# Patient Record
Sex: Female | Born: 1985 | Hispanic: No | Smoking: Never smoker
Health system: Southern US, Community
[De-identification: ages and names within clinical notes are randomized; demographics above are authoritative.]

## PROBLEM LIST (undated history)

## (undated) DIAGNOSIS — N189 Chronic kidney disease, unspecified: Secondary | ICD-10-CM

## (undated) DIAGNOSIS — C801 Malignant (primary) neoplasm, unspecified: Secondary | ICD-10-CM

## (undated) DIAGNOSIS — F32A Depression, unspecified: Secondary | ICD-10-CM

## (undated) DIAGNOSIS — F419 Anxiety disorder, unspecified: Secondary | ICD-10-CM

## (undated) HISTORY — PX: INDUCED ABORTION: SHX677

## (undated) HISTORY — PX: NEPHRECTOMY: SHX65

---

## 2014-03-22 DIAGNOSIS — F319 Bipolar disorder, unspecified: Secondary | ICD-10-CM | POA: Insufficient documentation

## 2015-03-21 DIAGNOSIS — Z85528 Personal history of other malignant neoplasm of kidney: Secondary | ICD-10-CM | POA: Insufficient documentation

## 2015-04-17 DIAGNOSIS — C649 Malignant neoplasm of unspecified kidney, except renal pelvis: Secondary | ICD-10-CM

## 2015-04-17 HISTORY — PX: NEPHRECTOMY: SHX65

## 2015-04-17 HISTORY — DX: Malignant neoplasm of unspecified kidney, except renal pelvis: C64.9

## 2016-04-27 DIAGNOSIS — F4310 Post-traumatic stress disorder, unspecified: Secondary | ICD-10-CM | POA: Insufficient documentation

## 2017-09-26 ENCOUNTER — Other Ambulatory Visit: Payer: Self-pay | Admitting: Otolaryngology

## 2017-09-26 DIAGNOSIS — R42 Dizziness and giddiness: Secondary | ICD-10-CM

## 2017-09-29 ENCOUNTER — Ambulatory Visit
Admission: RE | Admit: 2017-09-29 | Discharge: 2017-09-29 | Disposition: A | Discharge status: Home / Self Care | Payer: BLUE CROSS/BLUE SHIELD | Source: Ambulatory Visit | Attending: Otolaryngology | Admitting: Otolaryngology

## 2017-09-29 DIAGNOSIS — R42 Dizziness and giddiness: Secondary | ICD-10-CM

## 2017-09-29 MED ORDER — GADOBENATE DIMEGLUMINE 529 MG/ML IV SOLN
13.0000 mL | Freq: Once | INTRAVENOUS | Status: AC | PRN
  Administered 2017-09-29: 13 mL via INTRAVENOUS

## 2018-03-24 ENCOUNTER — Other Ambulatory Visit: Payer: Self-pay | Admitting: Family Medicine

## 2018-03-24 DIAGNOSIS — R102 Pelvic and perineal pain: Secondary | ICD-10-CM

## 2018-03-26 ENCOUNTER — Other Ambulatory Visit: Payer: BLUE CROSS/BLUE SHIELD

## 2018-03-28 ENCOUNTER — Other Ambulatory Visit: Payer: BLUE CROSS/BLUE SHIELD

## 2018-04-03 ENCOUNTER — Ambulatory Visit
Admission: RE | Admit: 2018-04-03 | Discharge: 2018-04-03 | Disposition: A | Discharge status: Home / Self Care | Payer: BLUE CROSS/BLUE SHIELD | Source: Ambulatory Visit | Attending: Family Medicine | Admitting: Family Medicine

## 2018-04-03 DIAGNOSIS — R102 Pelvic and perineal pain: Secondary | ICD-10-CM

## 2018-05-16 ENCOUNTER — Other Ambulatory Visit: Payer: Self-pay | Admitting: Family Medicine

## 2018-05-16 DIAGNOSIS — Z85528 Personal history of other malignant neoplasm of kidney: Secondary | ICD-10-CM

## 2018-05-31 ENCOUNTER — Other Ambulatory Visit: Payer: BLUE CROSS/BLUE SHIELD

## 2018-07-12 ENCOUNTER — Other Ambulatory Visit: Payer: BLUE CROSS/BLUE SHIELD

## 2018-09-04 ENCOUNTER — Other Ambulatory Visit: Payer: Self-pay

## 2018-09-04 ENCOUNTER — Ambulatory Visit
Admission: RE | Admit: 2018-09-04 | Discharge: 2018-09-04 | Disposition: A | Discharge status: Home / Self Care | Payer: BLUE CROSS/BLUE SHIELD | Source: Ambulatory Visit | Attending: Family Medicine | Admitting: Family Medicine

## 2018-09-04 DIAGNOSIS — Z85528 Personal history of other malignant neoplasm of kidney: Secondary | ICD-10-CM

## 2018-09-04 MED ORDER — GADOBENATE DIMEGLUMINE 529 MG/ML IV SOLN
15.0000 mL | Freq: Once | INTRAVENOUS | Status: AC | PRN
  Administered 2018-09-04: 15 mL via INTRAVENOUS

## 2019-01-07 ENCOUNTER — Other Ambulatory Visit (HOSPITAL_COMMUNITY)
Admission: RE | Admit: 2019-01-07 | Discharge: 2019-01-07 | Disposition: A | Discharge status: Home / Self Care | Payer: BC Managed Care – PPO | Source: Ambulatory Visit | Attending: Obstetrics and Gynecology | Admitting: Obstetrics and Gynecology

## 2019-01-07 DIAGNOSIS — Z124 Encounter for screening for malignant neoplasm of cervix: Secondary | ICD-10-CM | POA: Insufficient documentation

## 2019-07-19 ENCOUNTER — Emergency Department (HOSPITAL_COMMUNITY)
Admission: EM | Admit: 2019-07-19 | Discharge: 2019-07-19 | Disposition: A | Discharge status: Home / Self Care | Payer: BC Managed Care – PPO | Attending: Emergency Medicine | Admitting: Emergency Medicine

## 2019-07-19 ENCOUNTER — Other Ambulatory Visit: Payer: Self-pay

## 2019-07-19 ENCOUNTER — Encounter (HOSPITAL_COMMUNITY): Payer: Self-pay | Admitting: Emergency Medicine

## 2019-07-19 ENCOUNTER — Emergency Department (HOSPITAL_COMMUNITY): Payer: BC Managed Care – PPO

## 2019-07-19 DIAGNOSIS — Z79899 Other long term (current) drug therapy: Secondary | ICD-10-CM | POA: Diagnosis not present

## 2019-07-19 DIAGNOSIS — O200 Threatened abortion: Secondary | ICD-10-CM

## 2019-07-19 DIAGNOSIS — Z3A11 11 weeks gestation of pregnancy: Secondary | ICD-10-CM | POA: Insufficient documentation

## 2019-07-19 DIAGNOSIS — B9689 Other specified bacterial agents as the cause of diseases classified elsewhere: Secondary | ICD-10-CM

## 2019-07-19 DIAGNOSIS — O208 Other hemorrhage in early pregnancy: Secondary | ICD-10-CM | POA: Diagnosis present

## 2019-07-19 DIAGNOSIS — N760 Acute vaginitis: Secondary | ICD-10-CM | POA: Insufficient documentation

## 2019-07-19 LAB — I-STAT BETA HCG BLOOD, ED (MC, WL, AP ONLY): I-stat hCG, quantitative: >2000 m[IU]/mL — ABNORMAL HIGH (ref <5)

## 2019-07-19 LAB — CBC WITH DIFFERENTIAL/PLATELET
Abs Immature Granulocytes: 0.02 10*3/uL (ref 0.00–0.07)
Basophils Absolute: 0 10*3/uL (ref 0.0–0.1)
Basophils Relative: 0 %
Eosinophils Absolute: 0.1 10*3/uL (ref 0.0–0.5)
Eosinophils Relative: 2 %
HCT: 39 % (ref 36.0–46.0)
Hemoglobin: 12.8 g/dL (ref 12.0–15.0)
Immature Granulocytes: 0 %
Lymphocytes Relative: 19 %
Lymphs Abs: 1.2 10*3/uL (ref 0.7–4.0)
MCH: 29.8 pg (ref 26.0–34.0)
MCHC: 32.8 g/dL (ref 30.0–36.0)
MCV: 90.9 fL (ref 80.0–100.0)
Monocytes Absolute: 0.6 10*3/uL (ref 0.1–1.0)
Monocytes Relative: 10 %
Neutro Abs: 4.2 10*3/uL (ref 1.7–7.7)
Neutrophils Relative %: 69 %
Platelets: 278 10*3/uL (ref 150–400)
RBC: 4.29 MIL/uL (ref 3.87–5.11)
RDW: 12.3 % (ref 11.5–15.5)
WBC: 6.1 10*3/uL (ref 4.0–10.5)
nRBC: 0 % (ref 0.0–0.2)

## 2019-07-19 LAB — TYPE AND SCREEN
ABO/RH(D): A POS
Antibody Screen: NEGATIVE

## 2019-07-19 LAB — URINALYSIS, ROUTINE W REFLEX MICROSCOPIC
Bacteria, UA: NONE SEEN
Bilirubin Urine: NEGATIVE
Glucose, UA: NEGATIVE mg/dL
Ketones, ur: NEGATIVE mg/dL
Leukocytes,Ua: NEGATIVE
Nitrite: NEGATIVE
Protein, ur: NEGATIVE mg/dL
Specific Gravity, Urine: 1.01 (ref 1.005–1.030)
pH: 8 (ref 5.0–8.0)

## 2019-07-19 LAB — BASIC METABOLIC PANEL
Anion gap: 9 (ref 5–15)
BUN: 12 mg/dL (ref 6–20)
CO2: 24 mmol/L (ref 22–32)
Calcium: 9.6 mg/dL (ref 8.9–10.3)
Chloride: 103 mmol/L (ref 98–111)
Creatinine, Ser: 0.56 mg/dL (ref 0.44–1.00)
GFR calc Af Amer: >60 mL/min (ref >60)
GFR calc non Af Amer: >60 mL/min (ref >60)
Glucose, Bld: 95 mg/dL (ref 70–99)
Potassium: 3.9 mmol/L (ref 3.5–5.1)
Sodium: 136 mmol/L (ref 135–145)

## 2019-07-19 LAB — WET PREP, GENITAL
Sperm: NONE SEEN
Trich, Wet Prep: NONE SEEN
Yeast Wet Prep HPF POC: NONE SEEN

## 2019-07-19 LAB — HCG, QUANTITATIVE, PREGNANCY: hCG, Beta Chain, Quant, S: 5468 m[IU]/mL — ABNORMAL HIGH (ref <5)

## 2019-07-19 LAB — ABO/RH: ABO/RH(D): A POS

## 2019-07-19 NOTE — ED Provider Notes (Signed)
[facility] DEPT Provider Note   CSN: [id number] Arrival date & time: 07/19/19  [id number]     History Chief Complaint  Patient presents with   Vaginal Bleeding   Pregnant    Melanie Cooley is a 34 y.o. female currently [redacted] weeks pregnant presenting to the ED with a chief complaint of vaginal bleeding that occurred just prior to arrival.  Patient receiving prenatal care and had an ultrasound done at 7 weeks which showed single IUP.  She woke up this morning and noticed a single blood clot when she tried to urinate.  States that the bleeding is now minimal.  She reports intermittent cramping for the past month which she thought was due to the pregnancy itself.  She denies any nausea, vomiting, diarrhea, lightheadedness or anticoagulant use.  She has had 1 prior pregnancy for which she had an abortion at 6 weeks.  She denies any vaginal discharge, fever, chest pain or shortness of breath.  HPI     History reviewed. No pertinent past medical history.  There are no problems to display for this patient.   History reviewed. No pertinent surgical history.   OB History    Gravida  1   Para      Term      Preterm      AB      Living        SAB      TAB      Ectopic      Multiple      Live Births              No family history on file.  Social History   Tobacco Use   Smoking status: Never Smoker   Smokeless tobacco: Never Used  Substance Use Topics   Alcohol use: Never   Drug use: Never    Home Medications Prior to Admission medications   Medication Sig Start Date End Date Taking? Authorizing Provider  acetaminophen (TYLENOL) 325 MG tablet Take 650 mg by mouth every 6 (six) hours as needed for mild pain or headache.   Yes [provider]  calcium carbonate (TUMS - DOSED IN MG ELEMENTAL CALCIUM) 500 MG chewable tablet Chew 1 tablet by mouth 2 (two) times daily as needed for indigestion or heartburn.   Yes [provider]  famotidine (PEPCID) 20 MG tablet Take 20 mg by mouth 2 (two) times daily as needed for heartburn or indigestion.   Yes [provider]  naphazoline-pheniramine (NAPHCON-A) 0.025-0.3 % ophthalmic solution Place 1 drop into both eyes 4 (four) times daily as needed for eye irritation or allergies.   Yes [provider]  Prenatal MV-Min-Fe Fum-FA-DHA (PRENATAL 1 PO) Take 2 tablets by mouth in the morning and at bedtime.   Yes [provider]    Allergies    Compazine [prochlorperazine] and Other  Review of Systems   Review of Systems  Constitutional: Negative for appetite change, chills and fever.  HENT: Negative for ear pain, rhinorrhea, sneezing and sore throat.   Eyes: Negative for photophobia and visual disturbance.  Respiratory: Negative for cough, chest tightness, shortness of breath and wheezing.   Cardiovascular: Negative for chest pain and palpitations.  Gastrointestinal: Positive for abdominal pain. Negative for blood in stool, constipation, diarrhea, nausea and vomiting.  Genitourinary: Positive for vaginal bleeding. Negative for dysuria, hematuria and urgency.  Musculoskeletal: Negative for myalgias.  Skin: Negative for rash.  Neurological: Negative for dizziness, weakness and light-headedness.  Physical Exam Updated Vital Signs BP 127/82 (BP Location: Left Arm)    Pulse 78    Temp 98.4 F (36.9 C) (Oral)    Resp 16    Ht 5\' 6"  (1.676 m)    Wt 74.4 kg    SpO2 98%    BMI 26.47 kg/m   Physical Exam Vitals and nursing note reviewed. Exam conducted with a chaperone present.  Constitutional:      General: She is not in acute distress.    Appearance: She is well-developed.  HENT:     Head: Normocephalic and atraumatic.     Nose: Nose normal.  Eyes:     General: No scleral icterus.       Left eye: No discharge.     Conjunctiva/sclera: Conjunctivae normal.  Cardiovascular:     Rate and Rhythm: Normal rate and regular rhythm.      Heart sounds: Normal heart sounds. No murmur. No friction rub. No gallop.   Pulmonary:     Effort: Pulmonary effort is normal. No respiratory distress.     Breath sounds: Normal breath sounds.  Abdominal:     General: Bowel sounds are normal. There is no distension.     Palpations: Abdomen is soft.     Tenderness: There is no abdominal tenderness. There is no guarding.  Genitourinary:    Vagina: Bleeding present.     Cervix: No cervical motion tenderness.     Uterus: Not tender.      Adnexa:        Right: No tenderness.         Left: No tenderness.       Comments: Pelvic exam: normal external genitalia without evidence of trauma. VULVA: normal appearing vulva with no masses, tenderness or lesion. VAGINA: normal appearing vagina with normal color and discharge, no lesions. Small amount of blood in vaginal vault. CERVIX: normal appearing cervix without lesions, cervical motion tenderness absent, cervical os closed with out purulent discharge; No vaginal discharge. Wet prep and DNA probe for chlamydia and GC obtained.   ADNEXA: normal adnexa in size, nontender and no masses UTERUS: uterus is normal size, shape, consistency and nontender.   Musculoskeletal:        General: Normal range of motion.     Cervical back: Normal range of motion and neck supple.  Skin:    General: Skin is warm and dry.     Findings: No rash.  Neurological:     Mental Status: She is alert.     Motor: No abnormal muscle tone.     Coordination: Coordination normal.     ED Results / Procedures / Treatments   Labs (all labs ordered are listed, but only abnormal results are displayed) Labs Reviewed  WET PREP, GENITAL - Abnormal; Notable for the following components:      Result Value   Clue Cells Wet Prep HPF POC PRESENT (*)    WBC, Wet Prep HPF POC MODERATE (*)    All other components within normal limits  HCG, QUANTITATIVE, PREGNANCY - Abnormal; Notable for the following components:   hCG, Beta Chain,  Quant, S 5,468 (*)    All other components within normal limits  URINALYSIS, ROUTINE W REFLEX MICROSCOPIC - Abnormal; Notable for the following components:   Color, Urine STRAW (*)    Hgb urine dipstick SMALL (*)    All other components within normal limits  I-STAT BETA HCG BLOOD, ED ([facility], [facility], [facility] ONLY) - Abnormal; Notable for the following  components:   I-stat hCG, quantitative >2,000.0 (*)    All other components within normal limits  URINE CULTURE  BASIC METABOLIC PANEL  CBC WITH DIFFERENTIAL/PLATELET  TYPE AND SCREEN  ABO/RH  GC/CHLAMYDIA PROBE AMP ([facility]) NOT AT [facility]    EKG None  Radiology US OB Comp < 14 Wks  Result Date: 07/19/2019 CLINICAL DATA:  Vaginal bleeding. Pregnant patient. Patient is 11 weeks and 2 days pregnant based on her last menstrual period, 02/05/2020. Quantitative beta HCG level is 5,468. EXAM: OBSTETRIC <14 WK [place] AND TRANSVAGINAL OB US TECHNIQUE: Both transabdominal and transvaginal ultrasound examinations were performed for complete evaluation of the gestation as well as the maternal uterus, adnexal regions, and pelvic cul-de-sac. Transvaginal technique was performed to assess early pregnancy. COMPARISON:  None. FINDINGS: Intrauterine gestational sac: Single Yolk sac:  Not Visualized. Embryo:  Visualized. Cardiac Activity: Not Visualized. CRL:  18 mm   8 w   2 d Subchorionic hemorrhage:  None visualized. Maternal uterus/adnexae: No uterine masses. Cervix is closed. Normal sized left ovary, 3.1 x 2.0 x 3.1 cm. Right ovary not visualized. No adnexal masses or pelvic free fluid. IMPRESSION: 1. Intrauterine gestational sac containing an embryo, size, 18 mm, consistent with an 8 week, 2 day gestation. There was no detectable fetal heart activity or fetal heart tones. Findings meet definitive criteria for failed pregnancy. This follows SRU consensus guidelines: Diagnostic Criteria for Nonviable Pregnancy Early in the First Trimester. Alison Stalling J Med [phone]. 2. No  other abnormality. Electronically Signed   By: Lajean Manes M.D.   On: 07/19/2019 08:24   US OB Transvaginal  Result Date: 07/19/2019 CLINICAL DATA:  Vaginal bleeding. Pregnant patient. Patient is 11 weeks and 2 days pregnant based on her last menstrual period, 02/05/2020. Quantitative beta HCG level is 5,468. EXAM: OBSTETRIC <14 WK [place] AND TRANSVAGINAL OB US TECHNIQUE: Both transabdominal and transvaginal ultrasound examinations were performed for complete evaluation of the gestation as well as the maternal uterus, adnexal regions, and pelvic cul-de-sac. Transvaginal technique was performed to assess early pregnancy. COMPARISON:  None. FINDINGS: Intrauterine gestational sac: Single Yolk sac:  Not Visualized. Embryo:  Visualized. Cardiac Activity: Not Visualized. CRL:  18 mm   8 w   2 d Subchorionic hemorrhage:  None visualized. Maternal uterus/adnexae: No uterine masses. Cervix is closed. Normal sized left ovary, 3.1 x 2.0 x 3.1 cm. Right ovary not visualized. No adnexal masses or pelvic free fluid. IMPRESSION: 1. Intrauterine gestational sac containing an embryo, size, 18 mm, consistent with an 8 week, 2 day gestation. There was no detectable fetal heart activity or fetal heart tones. Findings meet definitive criteria for failed pregnancy. This follows SRU consensus guidelines: Diagnostic Criteria for Nonviable Pregnancy Early in the First Trimester. Alison Stalling J Med [phone]. 2. No other abnormality. Electronically Signed   By: Lajean Manes M.D.   On: 07/19/2019 08:24    Procedures Procedures (including critical care time)  Medications Ordered in ED Medications - No data to display  ED Course  I have reviewed the triage vital signs and the nursing notes.  Pertinent labs & imaging results that were available during my care of the patient were reviewed by me and considered in my medical decision making (see chart for details).  Clinical Course as of Jul 19 914  Sun Jul 19, 2019  0817 Clue  Cells Wet Prep HPF POC(!): PRESENT [HK]  0817 HCG, Beta ChainLaqueta Carina(!): 5,468 [HK]  [id number] Chart review shows that patient is  Rh positive.   [HK]    Clinical Course User Index [HK] Delia Heady, PA-C   MDM Rules/Calculators/A&P                      34 year old female who is currently [redacted] weeks pregnant presenting to the ED with a chief complaint of vaginal bleeding that occurred prior to arrival.  Patient receiving prenatal care, had an ultrasound done at 7 weeks which showed single IUP.  Woke up this morning and noticed a single blood clot.  Reports now minimal amount of bleeding.  She has had intermittent cramping for the past month which she thought was due to the pregnancy itself.  She had 1 prior pregnancy which she had an abortion at 16 weeks.  Denies any vaginal discharge, fever, chest pain, lightheadedness or shortness of breath.  Pelvic exam revealed small amount of blood in the vaginal vault, without cervical motion tenderness, adnexal tenderness or uterine tenderness.  hCG elevated today to the 5000.  Wet prep positive for clue cells.  Urinalysis without signs of infection.  CBC, BMP are unremarkable.  Ultrasound shows IUP at 8 weeks unfortunately without any fetal heart activity, concerning for failed pregnancy. Suspect that this is the cause of her symptoms today.  Informed patient of these results and she states that she will follow up with her OB-GYN for further management. She is Rh + so no Rhogam indicated. Advised to return for any worsening symptoms.  Patient is hemodynamically stable, in NAD. Evaluation does not show pathology that would require ongoing emergent intervention or inpatient treatment. I have personally reviewed and interpreted all lab work and imaging at today's ED visit. I explained the diagnosis to the patient. Pain has been managed and has no complaints prior to discharge. Patient is comfortable with above plan and is stable for discharge at this time. All questions  were answered prior to disposition. Strict return precautions for returning to the ED were discussed. Encouraged follow up with PCP.   An After Visit Summary was printed and given to the patient.   Portions of this note were generated with [employer]. Dictation errors may occur despite best attempts at proofreading.  Final Clinical Impression(s) / ED Diagnoses Final diagnoses:  Threatened miscarriage  Bacterial vaginosis    Rx / DC Orders ED Discharge Orders    None       Delia Heady, PA-C 07/19/19 [id number]    Blanchie Dessert, MD 07/22/19 [phone]

## 2019-07-19 NOTE — Discharge Instructions (Signed)
It is important for you to follow up with your OB-GYN provider regarding today's ultrasound findings. Return to the ED if you start to experience worsening bleeding, lightheadedness, severe abdominal discomfort, shortness of breath.

## 2019-07-19 NOTE — ED Notes (Signed)
Pt up walking to the bathroom. NAD noted. Will continue to monitor.  

## 2019-07-19 NOTE — ED Triage Notes (Signed)
Patient here from home reporting vag bleeding that started 21min ago with clots. 11.5 weeks preg with first child.

## 2019-07-20 LAB — GC/CHLAMYDIA PROBE AMP (~~LOC~~) NOT AT ARMC
Chlamydia: NEGATIVE
Comment: NEGATIVE
Comment: NORMAL
Neisseria Gonorrhea: NEGATIVE

## 2019-07-20 LAB — URINE CULTURE

## 2019-07-21 ENCOUNTER — Telehealth: Payer: Self-pay | Admitting: Emergency Medicine

## 2019-07-21 NOTE — Telephone Encounter (Signed)
Post ED Visit - Positive Culture Follow-up: Successful Patient Follow-Up  Culture assessed and recommendations reviewed by:  []  Elenor Quinones, Pharm.D. []  Heide Guile, Pharm.D., BCPS AQ-ID []  Parks Neptune, Pharm.D., BCPS []  Alycia Rossetti, Pharm.D., BCPS []  Ayden, Pharm.D., BCPS, AAHIVP []  Legrand Como, Pharm.D., BCPS, AAHIVP []  Salome Arnt, PharmD, BCPS []  Johnnette Gourd, PharmD, BCPS []  Hughes Better, PharmD, BCPS []  Leeroy Cha, PharmD  + clue cells and BV on wet prep and urine culture with group B and multiple species   Changes discussed with ED provider: Dr Karle Starch Report faxed to OB/GYN MD Dr Thurnell Lose @ [phone] and phone number [phone] confirmed current patient, has upcoming appt on 07/29/2019   Hazle Nordmann 07/21/2019, 12:55 PM

## 2019-07-21 NOTE — Progress Notes (Signed)
ED Antimicrobial Stewardship Positive Culture Follow Up   Melanie Cooley is an 34 y.o. pregnant female who presented to [facility] on 07/19/2019 with a chief complaint of vaginal bleeding. Patient receiving prenatal care and had an ultrasound at 7 weeks that showed single IUP. Patient denies vaginal discharge or fever. Ultrasound done in ED shows IUP at 8 weeks without any fetal heart activity, concerning for failed pregnancy. UA with small Hgb, no bacteria, 0-5 WBC. Wet prep positive for clue cells, moderate WBC. Patient discharged without any medications. Patient stated she will follow up with OB-GYN for further management.    Recent Results (from the past 720 hour(s))  Urine culture     Status: Abnormal   Collection Time: 07/19/19  6:42 AM   Specimen: Urine, Random  Result Value Ref Range Status   Specimen Description   Final    URINE, RANDOM Performed at [facility] [address], [place], [place] [address]    Special Requests   Final    NONE Performed at [facility], [place] [address] [place], [place] [address]    Culture (A)  Final    MULTIPLE SPECIES PRESENT, SUGGEST RECOLLECTION 3,000 COLONIES/mL GROUP B STREP(S.AGALACTIAE)ISOLATED    Report Status 07/20/2019 FINAL  Final  Wet prep, genital     Status: Abnormal   Collection Time: 07/19/19  6:59 AM  Result Value Ref Range Status   Yeast Wet Prep HPF POC NONE SEEN NONE SEEN Final   Trich, Wet Prep NONE SEEN NONE SEEN Final   Clue Cells Wet Prep HPF POC PRESENT (A) NONE SEEN Final   WBC, Wet Prep HPF POC MODERATE (A) NONE SEEN Final   Sperm NONE SEEN  Final    Comment: Performed at [facility], [place] [address], [place], [place] [address]    Plan:  Patient diagnosed with bacterial vaginosis (BV) in the ED. Please fax/call the patient's OB-GYN to notify MD of positive clue cells on wet prep, BV diagnosis, and urine culture with 3000 colonies/mL group B strep + multiple  species for OB-GYN to determine whether treatment is indicated.   ED Provider: Calvert Cantor, MD   Lindell Spar M 07/21/2019, 12:09 PM Clinical Pharmacist [phone]

## 2020-04-26 ENCOUNTER — Other Ambulatory Visit: Payer: Self-pay | Admitting: Family Medicine

## 2020-04-26 DIAGNOSIS — Z85528 Personal history of other malignant neoplasm of kidney: Secondary | ICD-10-CM

## 2020-05-13 ENCOUNTER — Ambulatory Visit
Admission: RE | Admit: 2020-05-13 | Discharge: 2020-05-13 | Disposition: A | Discharge status: Home / Self Care | Payer: Medicaid Other | Source: Ambulatory Visit | Attending: Family Medicine | Admitting: Family Medicine

## 2020-05-13 ENCOUNTER — Other Ambulatory Visit: Payer: Self-pay

## 2020-05-13 ENCOUNTER — Other Ambulatory Visit: Payer: BC Managed Care – PPO

## 2020-05-13 DIAGNOSIS — Z85528 Personal history of other malignant neoplasm of kidney: Secondary | ICD-10-CM

## 2020-05-13 MED ORDER — GADOBENATE DIMEGLUMINE 529 MG/ML IV SOLN
15.0000 mL | Freq: Once | INTRAVENOUS | Status: AC | PRN
  Administered 2020-05-13: 15 mL via INTRAVENOUS

## 2020-08-25 ENCOUNTER — Other Ambulatory Visit: Payer: Self-pay

## 2020-08-25 ENCOUNTER — Emergency Department (HOSPITAL_COMMUNITY)
Admission: EM | Admit: 2020-08-25 | Discharge: 2020-08-25 | Disposition: A | Discharge status: Home / Self Care | Payer: 59 | Attending: Emergency Medicine | Admitting: Emergency Medicine

## 2020-08-25 ENCOUNTER — Emergency Department (HOSPITAL_COMMUNITY): Payer: 59

## 2020-08-25 ENCOUNTER — Encounter (HOSPITAL_COMMUNITY): Payer: Self-pay

## 2020-08-25 DIAGNOSIS — R42 Dizziness and giddiness: Secondary | ICD-10-CM | POA: Insufficient documentation

## 2020-08-25 DIAGNOSIS — K5900 Constipation, unspecified: Secondary | ICD-10-CM | POA: Insufficient documentation

## 2020-08-25 DIAGNOSIS — J45909 Unspecified asthma, uncomplicated: Secondary | ICD-10-CM | POA: Insufficient documentation

## 2020-08-25 DIAGNOSIS — R109 Unspecified abdominal pain: Secondary | ICD-10-CM | POA: Diagnosis not present

## 2020-08-25 DIAGNOSIS — R52 Pain, unspecified: Secondary | ICD-10-CM

## 2020-08-25 LAB — CBC WITH DIFFERENTIAL/PLATELET
Abs Immature Granulocytes: 0.04 10*3/uL (ref 0.00–0.07)
Basophils Absolute: 0 10*3/uL (ref 0.0–0.1)
Basophils Relative: 0 %
Eosinophils Absolute: 0.1 10*3/uL (ref 0.0–0.5)
Eosinophils Relative: 1 %
HCT: 35.7 % — ABNORMAL LOW (ref 36.0–46.0)
Hemoglobin: 11.8 g/dL — ABNORMAL LOW (ref 12.0–15.0)
Immature Granulocytes: 0 %
Lymphocytes Relative: 15 %
Lymphs Abs: 1.9 10*3/uL (ref 0.7–4.0)
MCH: 30.3 pg (ref 26.0–34.0)
MCHC: 33.1 g/dL (ref 30.0–36.0)
MCV: 91.8 fL (ref 80.0–100.0)
Monocytes Absolute: 1.2 10*3/uL — ABNORMAL HIGH (ref 0.1–1.0)
Monocytes Relative: 9 %
Neutro Abs: 9.5 10*3/uL — ABNORMAL HIGH (ref 1.7–7.7)
Neutrophils Relative %: 75 %
Platelets: 256 10*3/uL (ref 150–400)
RBC: 3.89 MIL/uL (ref 3.87–5.11)
RDW: 12.5 % (ref 11.5–15.5)
WBC: 12.6 10*3/uL — ABNORMAL HIGH (ref 4.0–10.5)
nRBC: 0 % (ref 0.0–0.2)

## 2020-08-25 LAB — COMPREHENSIVE METABOLIC PANEL
ALT: 52 U/L — ABNORMAL HIGH (ref 0–44)
AST: 37 U/L (ref 15–41)
Albumin: 3.9 g/dL (ref 3.5–5.0)
Alkaline Phosphatase: 30 U/L — ABNORMAL LOW (ref 38–126)
Anion gap: 9 (ref 5–15)
BUN: 10 mg/dL (ref 6–20)
CO2: 23 mmol/L (ref 22–32)
Calcium: 9.4 mg/dL (ref 8.9–10.3)
Chloride: 104 mmol/L (ref 98–111)
Creatinine, Ser: 0.7 mg/dL (ref 0.44–1.00)
GFR, Estimated: >60 mL/min (ref >60)
Glucose, Bld: 82 mg/dL (ref 70–99)
Potassium: 3.8 mmol/L (ref 3.5–5.1)
Sodium: 136 mmol/L (ref 135–145)
Total Bilirubin: 0.4 mg/dL (ref 0.3–1.2)
Total Protein: 6.9 g/dL (ref 6.5–8.1)

## 2020-08-25 LAB — URINALYSIS, ROUTINE W REFLEX MICROSCOPIC
Bilirubin Urine: NEGATIVE
Glucose, UA: NEGATIVE mg/dL
Hgb urine dipstick: NEGATIVE
Ketones, ur: NEGATIVE mg/dL
Nitrite: NEGATIVE
Protein, ur: NEGATIVE mg/dL
Specific Gravity, Urine: 1.01 (ref 1.005–1.030)
pH: 9 — ABNORMAL HIGH (ref 5.0–8.0)

## 2020-08-25 LAB — I-STAT BETA HCG BLOOD, ED (MC, WL, AP ONLY): I-stat hCG, quantitative: 42.5 m[IU]/mL — ABNORMAL HIGH (ref <5)

## 2020-08-25 LAB — HCG, QUANTITATIVE, PREGNANCY: hCG, Beta Chain, Quant, S: 39 m[IU]/mL — ABNORMAL HIGH (ref <5)

## 2020-08-25 LAB — PREGNANCY, URINE: Preg Test, Ur: POSITIVE — AB

## 2020-08-25 MED ORDER — MILK AND MOLASSES ENEMA
1.0000 | Freq: Once | RECTAL | Status: AC
  Administered 2020-08-25: 240 mL via RECTAL
  Filled 2020-08-25: qty 240

## 2020-08-25 NOTE — ED Provider Notes (Signed)
[facility] DEPT Provider Note   CSN: [id number] Arrival date & time: 08/25/20  7106     History Chief Complaint  Patient presents with  . Abdominal Pain    Melanie Cooley is a 35 y.o. female with a history of asthma, spontaneous abortion, anxiety who presents the emergency department with a chief complaint of abdominal pain and bloating.  The patient endorses constant, worsening abdominal pain and distension over the last week.  Patient had an egg retrieval procedure with her fertility specialist on 5/9. Following the procedure, she was started on cabergoline to prevent ovarian hyperstimulation syndrome.  She reports that she was gaining weight rapidly, but has not put on any additional weight over the last few days.  However, she continues to feel very bloated and is feeling constipated.  States that she feels as if there is a ball of stool near her rectum and her rectum feels distended.  She has been taking over-the-counter stool softeners, fiber, and increasing her water intake over the last few days without improvement.  Yesterday, she used an over-the-counter suppository and was able to pass a small amount of liquid stool, but she did not have any improvement in the rectal discomfort.  She reports intermittent nausea.  She has intermittently had dizziness, but suspects it may be from the cabergoline.  No fever, chills, diarrhea, shortness of breath, chest pain, vaginal discharge, dysuria, diaphoresis, rash, vomiting, or decreased urinary output.  She reports that she was ejected with hCG on 5/7.  Reports that she was on doxycycline prior to the procedure.   Fertility Specialist: Dr. Governor Specking.   The history is provided by the patient and medical records. No language interpreter was used.       History reviewed. No pertinent past medical history.  There are no problems to display for this patient.   History reviewed. No pertinent surgical  history.   OB History    Gravida  1   Para      Term      Preterm      AB      Living        SAB      IAB      Ectopic      Multiple      Live Births              History reviewed. No pertinent family history.  Social History   Tobacco Use  . Smoking status: Never Smoker  . Smokeless tobacco: Never Used  Substance Use Topics  . Alcohol use: Never  . Drug use: Never    Home Medications Prior to Admission medications   Medication Sig Start Date End Date Taking? Authorizing Provider  acetaminophen (TYLENOL) 325 MG tablet Take 650 mg by mouth every 6 (six) hours as needed for mild pain or headache.    [provider]  calcium carbonate (TUMS - DOSED IN MG ELEMENTAL CALCIUM) 500 MG chewable tablet Chew 1 tablet by mouth 2 (two) times daily as needed for indigestion or heartburn.    [provider]  famotidine (PEPCID) 20 MG tablet Take 20 mg by mouth 2 (two) times daily as needed for heartburn or indigestion.    [provider]  naphazoline-pheniramine (NAPHCON-A) 0.025-0.3 % ophthalmic solution Place 1 drop into both eyes 4 (four) times daily as needed for eye irritation or allergies.    [provider]  Prenatal MV-Min-Fe Fum-FA-DHA (PRENATAL 1 PO) Take 2 tablets by  mouth in the morning and at bedtime.    [provider]    Allergies    Compazine [prochlorperazine] and Other  Review of Systems   Review of Systems  Constitutional: Negative for activity change, chills, diaphoresis and fever.  HENT: Negative for congestion and sore throat.   Respiratory: Negative for shortness of breath.   Cardiovascular: Positive for palpitations. Negative for chest pain.  Gastrointestinal: Positive for abdominal distention, abdominal pain, constipation and nausea. Negative for anal bleeding, blood in stool, diarrhea, rectal pain and vomiting.  Genitourinary: Negative for decreased urine volume, difficulty urinating, dysuria,  frequency, hematuria, menstrual problem, urgency, vaginal bleeding, vaginal discharge and vaginal pain.  Musculoskeletal: Negative for back pain, myalgias, neck pain and neck stiffness.  Skin: Negative for rash and wound.  Allergic/Immunologic: Negative for immunocompromised state.  Neurological: Positive for dizziness. Negative for seizures, syncope, speech difficulty, weakness, light-headedness, numbness and headaches.  Psychiatric/Behavioral: Negative for confusion.    Physical Exam Updated Vital Signs BP 112/71   Pulse 73   Temp 98.3 F (36.8 C) (Oral)   Resp 16   Ht 5\' 6"  (1.676 m)   Wt 76.2 kg   SpO2 100%   BMI 27.12 kg/m   Physical Exam Vitals and nursing note reviewed.  Constitutional:      General: She is not in acute distress.    Appearance: She is not ill-appearing, toxic-appearing or diaphoretic.     Comments: Well-appearing.  No acute distress.  HENT:     Head: Normocephalic.  Eyes:     Conjunctiva/sclera: Conjunctivae normal.  Cardiovascular:     Rate and Rhythm: Normal rate and regular rhythm.     Pulses: Normal pulses.     Heart sounds: Normal heart sounds. No murmur heard. No friction rub. No gallop.   Pulmonary:     Effort: Pulmonary effort is normal. No respiratory distress.     Breath sounds: No stridor. No wheezing, rhonchi or rales.  Chest:     Chest wall: No tenderness.  Abdominal:     General: There is distension.     Palpations: Abdomen is soft. There is no mass.     Tenderness: There is abdominal tenderness. There is no right CVA tenderness, left CVA tenderness, guarding or rebound.     Hernia: No hernia is present.     Comments: Hypoactive bowel sounds in the bilateral upper quadrants.  Hyperactive bowel sounds in the bilateral lower quadrants abdomen is distended, but soft.  Significantly tender to palpation in the left lower quadrant.  Minimal tenderness to palpation in the right lower quadrant.  Upper abdomen is nontender.  There is  voluntary guarding with palpation in the left lower quadrant.  No rebound.  No CVA tenderness.  No tenderness over McBurney's point.  Negative Murphy sign.  Musculoskeletal:        General: No tenderness.     Cervical back: Neck supple.     Right lower leg: No edema.     Left lower leg: No edema.  Skin:    General: Skin is warm.     Capillary Refill: Capillary refill takes less than 2 seconds.     Findings: No rash.  Neurological:     Mental Status: She is alert.  Psychiatric:        Behavior: Behavior normal.     ED Results / Procedures / Treatments   Labs (all labs ordered are listed, but only abnormal results are displayed) Labs Reviewed  COMPREHENSIVE METABOLIC PANEL -  Abnormal; Notable for the following components:      Result Value   ALT 52 (*)    Alkaline Phosphatase 30 (*)    All other components within normal limits  URINALYSIS, ROUTINE W REFLEX MICROSCOPIC - Abnormal; Notable for the following components:   pH 9.0 (*)    Leukocytes,Ua SMALL (*)    Bacteria, UA RARE (*)    All other components within normal limits  CBC WITH DIFFERENTIAL/PLATELET - Abnormal; Notable for the following components:   WBC 12.6 (*)    Hemoglobin 11.8 (*)    HCT 35.7 (*)    Neutro Abs 9.5 (*)    Monocytes Absolute 1.2 (*)    All other components within normal limits  I-STAT BETA HCG BLOOD, ED ([facility], [facility], [facility] ONLY) - Abnormal; Notable for the following components:   I-stat hCG, quantitative 42.5 (*)    All other components within normal limits  PREGNANCY, URINE  HCG, QUANTITATIVE, PREGNANCY    EKG None  Radiology No results found.  Procedures Procedures   Medications Ordered in ED Medications - No data to display  ED Course  I have reviewed the triage vital signs and the nursing notes.  Pertinent labs & imaging results that were available during my care of the patient were reviewed by me and considered in my medical decision making (see chart for details).    MDM  Rules/Calculators/A&P                          35 year old female with a history of asthma, spontaneous abortion, anxiety who presents abdominal pain, swelling, distention, constipation with intermittent nausea, rectal pain, dizziness.  Patient underwent an egg retrieval by her fertility specialist on 5/9.  Vital signs are stable.  The patient was seen and independently evaluated by Dr. Nicholes Stairs, attending physician.  On exam, she has bilateral lower abdominal tenderness, left, significantly greater than right.  Hyperactive bowel sounds in the bilateral lower abdomen with hypoactive bowel sounds in the upper abdomen.  No rebound or guarding.   Given patient's recent procedure, she will need work-up for abdominal compartment syndrome given risk for ovarian hyperstimulation syndrome from recent procedure. Fortunately, risk for developing OHSS is approximately half since the patient is on cabergoline.  Labs and imaging have been reviewed and independently in interpreted by me.  I-STAT hCG is elevated at 42.5.  I suspect this is from recent hCG injection on 5/7.  However, she will need a quantitative hCG.  CBC with mild leukocytosis of 12.6.  Hemoglobin is stable at 11.8.  Metabolic panel with mild increase in ALT at 52.  Normal AST.  UA is not concerning for infection.  Patient will also need pelvic ultrasound to further assess for ovarian torsion given LLQ pain and KUB to further assess for constipation.  Patient has been updated and is agreeable with plan at this time.  If work-up is overall reassuring and is consistent with constipation, would recommend milk of molasses enema.  Patient care transferred to [facility] at the end of my shift to follow-up on labs. Patient presentation, ED course, and plan of care discussed with review of all pertinent labs and imaging. Please see his/her note for further details regarding further ED course and disposition.  Patient care transferred to PA [facility] at the end of  my shift to follow-up on labs, ultrasound, KUB, reassess.  If work-up is consistent with constipation and there is no evidence of abdominal compartment  syndrome, would recommend milk of molasses enema.  Patient presentation, ED course, and plan of care discussed with review of all pertinent labs and imaging. Please see his/her note for further details regarding further ED course and disposition.   Final Clinical Impression(s) / ED Diagnoses Final diagnoses:  Pain    Rx / DC Orders ED Discharge Orders    None       Joanne Gavel, PA-C 08/25/20 0710    Palumbo, April, MD 08/25/20 2302

## 2020-08-25 NOTE — ED Provider Notes (Signed)
Received sign out at beginning of shift, please see previous provider for complete H&P.  Pt recently had an egg retrieval from Dr. Maura Crandall from [facility] from 4 days ago. Initial concern for abdominal compartment syndrome.  However, suspicion for constipation.  PT does have elevated hcg however she does receive hcg injection as part of the fertility care.    9:06 AM Initial i-STAT hCG is 42.5, urine pregnancy test is positive, and a beta-hCG of 39.  These elevated values likely secondary to recent IVF procedure.  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  Enlarged heterogeneous ovaries contain multiple follicles likely result of ovarian stimulation however ovarian hyperstimulation syndrome is not entirely excluded.  There is no evidence of ovarian mass or ovarian torsion.  Patient is aware that she does have a uterine leiomyoma.  Also perform digital rectal exam with hard impacted stool at the rectal vault.  I did offer to perform manual disimpaction however patient preference would be for enema first.  We will give Dulcolax enema.  KUB ordered  10:52 AM KUB demonstrated moderate stool burden throughout colon.  After receiving milk of molasses, patient was able to have adequate bowel movement and felt much better.  I do believe her symptoms likely secondary to constipation and less likely to be complication from recent egg extraction IVF procedure.  I encourage patient to follow-up closely with her specialist and also recommend continue with MiraLAX, and enema as needed.  Return precaution given.  BP 90/79   Pulse 79   Temp 98.3 F (36.8 C) (Oral)   Resp 16   Ht 5\' 6"  (1.676 m)   Wt 76.2 kg   SpO2 100%   BMI 27.12 kg/m   Results for orders placed or performed during the hospital encounter of 08/25/20  Comprehensive metabolic panel  Result Value Ref Range   Sodium 136 135 - 145 mmol/L   Potassium 3.8 3.5 - 5.1 mmol/L   Chloride 104 98 - 111 mmol/L   CO2 23 22 -  32 mmol/L   Glucose, Bld 82 70 - 99 mg/dL   BUN 10 6 - 20 mg/dL   Creatinine, Ser 0.70 0.44 - 1.00 mg/dL   Calcium 9.4 8.9 - 10.3 mg/dL   Total Protein 6.9 6.5 - 8.1 g/dL   Albumin 3.9 3.5 - 5.0 g/dL   AST 37 15 - 41 U/L   ALT 52 (H) 0 - 44 U/L   Alkaline Phosphatase 30 (L) 38 - 126 U/L   Total Bilirubin 0.4 0.3 - 1.2 mg/dL   GFR, Estimated >60 >60 mL/min   Anion gap 9 5 - 15  Urinalysis, Routine w reflex microscopic Urine, Clean Catch  Result Value Ref Range   Color, Urine YELLOW YELLOW   APPearance CLEAR CLEAR   Specific Gravity, Urine 1.010 1.005 - 1.030   pH 9.0 (H) 5.0 - 8.0   Glucose, UA NEGATIVE NEGATIVE mg/dL   Hgb urine dipstick NEGATIVE NEGATIVE   Bilirubin Urine NEGATIVE NEGATIVE   Ketones, ur NEGATIVE NEGATIVE mg/dL   Protein, ur NEGATIVE NEGATIVE mg/dL   Nitrite NEGATIVE NEGATIVE   Leukocytes,Ua SMALL (A) NEGATIVE   RBC / HPF 0-5 0 - 5 RBC/hpf   WBC, UA 0-5 0 - 5 WBC/hpf   Bacteria, UA RARE (A) NONE SEEN   Squamous Epithelial / LPF 0-5 0 - 5   Mucus PRESENT   CBC with Differential  Result Value Ref Range   WBC 12.6 (H) 4.0 - 10.5 K/uL  RBC 3.89 3.87 - 5.11 MIL/uL   Hemoglobin 11.8 (L) 12.0 - 15.0 g/dL   HCT 35.7 (L) 36.0 - 46.0 %   MCV 91.8 80.0 - 100.0 fL   MCH 30.3 26.0 - 34.0 pg   MCHC 33.1 30.0 - 36.0 g/dL   RDW 12.5 11.5 - 15.5 %   Platelets 256 150 - 400 K/uL   nRBC 0.0 0.0 - 0.2 %   Neutrophils Relative % 75 %   Neutro Abs 9.5 (H) 1.7 - 7.7 K/uL   Lymphocytes Relative 15 %   Lymphs Abs 1.9 0.7 - 4.0 K/uL   Monocytes Relative 9 %   Monocytes Absolute 1.2 (H) 0.1 - 1.0 K/uL   Eosinophils Relative 1 %   Eosinophils Absolute 0.1 0.0 - 0.5 K/uL   Basophils Relative 0 %   Basophils Absolute 0.0 0.0 - 0.1 K/uL   Immature Granulocytes 0 %   Abs Immature Granulocytes 0.04 0.00 - 0.07 K/uL  Pregnancy, urine  Result Value Ref Range   Preg Test, Ur POSITIVE (A) NEGATIVE  hCG, quantitative, pregnancy  Result Value Ref Range   hCG, Beta Chain, Quant,  S 39 (H) <5 mIU/mL  I-Stat beta hCG blood, ED  Result Value Ref Range   I-stat hCG, quantitative 42.5 (H) <5 mIU/mL   Comment 3           DG Abdomen 1 View  Result Date: 08/25/2020 CLINICAL DATA:  Constipation EXAM: ABDOMEN - 1 VIEW COMPARISON:  None. FINDINGS: Diffuse stool seen throughout colon. There is no bowel dilatation or air-fluid level to suggest bowel obstruction. No free air. Visualized lung bases clear. No abnormal calcifications. IMPRESSION: Diffuse stool throughout colon in a pattern suggesting constipation. No bowel obstruction or free air. Visualized lung bases clear. Electronically Signed   By: Lowella Grip III M.D.   On: 08/25/2020 09:37   US PELVIC COMPLETE W TRANSVAGINAL AND TORSION R/O  Result Date: 08/25/2020 CLINICAL DATA:  Pelvic pain, had egg harvest 3 days ago. LMP 08/09/2020 EXAM: TRANSABDOMINAL AND TRANSVAGINAL ULTRASOUND OF PELVIS DOPPLER ULTRASOUND OF OVARIES TECHNIQUE: Both transabdominal and transvaginal ultrasound examinations of the pelvis were performed. Transabdominal technique was performed for global imaging of the pelvis including uterus, ovaries, adnexal regions, and pelvic cul-de-sac. It was necessary to proceed with endovaginal exam following the transabdominal exam to visualize the cervix and adnexa. Color and duplex Doppler ultrasound was utilized to evaluate blood flow to the ovaries. COMPARISON:  04/03/2018 FINDINGS: Uterus Measurements: 9.2 x 4.7 x 4.8 cm = volume: 110 mL. Anteverted. Small intramural leiomyoma at posterior mid uterus 10 x 10 x 11 mm. No additional masses. Endometrium Thickness: 7 mm.  No endometrial fluid or focal abnormalities Right ovary Measurements: 8.4 x 6.2 x 6.2 cm = volume: 167 mL. Enlarged and mildly heterogeneous. Multiple follicles. No dominant mass. Internal blood flow present on color Doppler imaging. Left ovary Measurements: 6.5 x 7.9 x 6.4 cm = volume: 171 mL. Enlarged and mildly heterogeneous. Multiple follicles. No  dominant mass. Internal blood flow present on color Doppler imaging. Pulsed Doppler evaluation of both ovaries demonstrates normal low-resistance arterial and venous waveforms. Other findings Trace free pelvic fluid.  No adnexal masses. IMPRESSION: 11 mm uterine leiomyoma. Uterus and endometrial complex otherwise unremarkable. Enlarged heterogeneous ovaries containing multiple follicles; this may be the result of ovarian stimulation, ovarian hyperstimulation syndrome not excluded. This is not the typical appearance seen with polycystic ovarian syndrome. No evidence of dominant ovarian mass or ovarian torsion. Electronically Signed  By: Lavonia Dana M.D.   On: 08/25/2020 08:33       Domenic Moras, PA-C 08/25/20 1053    Truddie Hidden, MD 08/25/20 [phone]

## 2020-08-25 NOTE — ED Notes (Signed)
Pt declines blood work until speaking with provider.

## 2020-08-25 NOTE — ED Notes (Signed)
Milk of molasses enema administered, bedside commode in room. Encouraged pt to hold bowels as long as possible.

## 2020-08-25 NOTE — Discharge Instructions (Signed)
Your symptoms are likely due to constipation.  Continue with current regimen which includes MiraLAX, enema, stool softener for relief.  Follow-up closely with your specialist for further care.  Return if you have any concern.

## 2020-08-25 NOTE — ED Triage Notes (Signed)
Pt reports lower abdominal pain and no BM since this past Sunday. Pt reports recent invasive procedure where she had eggs removed.

## 2020-08-25 NOTE — ED Notes (Signed)
Pt refused blood work at this time. Pt states she doesn't feel she needs to have it done.

## 2020-12-11 ENCOUNTER — Other Ambulatory Visit: Payer: Self-pay

## 2021-03-23 ENCOUNTER — Other Ambulatory Visit: Payer: Self-pay

## 2021-03-23 ENCOUNTER — Encounter (HOSPITAL_COMMUNITY): Payer: Self-pay | Admitting: Obstetrics & Gynecology

## 2021-03-23 ENCOUNTER — Inpatient Hospital Stay (HOSPITAL_COMMUNITY)
Admission: AD | Admit: 2021-03-23 | Discharge: 2021-03-23 | Disposition: A | Discharge status: Home / Self Care | Payer: 59 | Attending: Obstetrics & Gynecology | Admitting: Obstetrics & Gynecology

## 2021-03-23 DIAGNOSIS — O133 Gestational [pregnancy-induced] hypertension without significant proteinuria, third trimester: Secondary | ICD-10-CM | POA: Diagnosis not present

## 2021-03-23 DIAGNOSIS — O09523 Supervision of elderly multigravida, third trimester: Secondary | ICD-10-CM | POA: Diagnosis not present

## 2021-03-23 DIAGNOSIS — O26833 Pregnancy related renal disease, third trimester: Secondary | ICD-10-CM | POA: Insufficient documentation

## 2021-03-23 DIAGNOSIS — Z3A28 28 weeks gestation of pregnancy: Secondary | ICD-10-CM | POA: Insufficient documentation

## 2021-03-23 DIAGNOSIS — O163 Unspecified maternal hypertension, third trimester: Secondary | ICD-10-CM

## 2021-03-23 DIAGNOSIS — N189 Chronic kidney disease, unspecified: Secondary | ICD-10-CM | POA: Insufficient documentation

## 2021-03-23 HISTORY — DX: Anxiety disorder, unspecified: F41.9

## 2021-03-23 HISTORY — DX: Malignant (primary) neoplasm, unspecified: C80.1

## 2021-03-23 HISTORY — DX: Chronic kidney disease, unspecified: N18.9

## 2021-03-23 LAB — URINALYSIS, ROUTINE W REFLEX MICROSCOPIC
Bilirubin Urine: NEGATIVE
Glucose, UA: NEGATIVE mg/dL
Hgb urine dipstick: NEGATIVE
Ketones, ur: NEGATIVE mg/dL
Leukocytes,Ua: NEGATIVE
Nitrite: NEGATIVE
Protein, ur: NEGATIVE mg/dL
Specific Gravity, Urine: <1.005 — ABNORMAL LOW (ref 1.005–1.030)
pH: 6 (ref 5.0–8.0)

## 2021-03-23 LAB — COMPREHENSIVE METABOLIC PANEL
ALT: 18 U/L (ref 0–44)
AST: 24 U/L (ref 15–41)
Albumin: 3.1 g/dL — ABNORMAL LOW (ref 3.5–5.0)
Alkaline Phosphatase: 69 U/L (ref 38–126)
Anion gap: 7 (ref 5–15)
BUN: 10 mg/dL (ref 6–20)
CO2: 22 mmol/L (ref 22–32)
Calcium: 9.2 mg/dL (ref 8.9–10.3)
Chloride: 105 mmol/L (ref 98–111)
Creatinine, Ser: 0.59 mg/dL (ref 0.44–1.00)
GFR, Estimated: >60 mL/min (ref >60)
Glucose, Bld: 91 mg/dL (ref 70–99)
Potassium: 3.6 mmol/L (ref 3.5–5.1)
Sodium: 134 mmol/L — ABNORMAL LOW (ref 135–145)
Total Bilirubin: 0.2 mg/dL — ABNORMAL LOW (ref 0.3–1.2)
Total Protein: 6.7 g/dL (ref 6.5–8.1)

## 2021-03-23 LAB — CBC
HCT: 32.7 % — ABNORMAL LOW (ref 36.0–46.0)
Hemoglobin: 11.4 g/dL — ABNORMAL LOW (ref 12.0–15.0)
MCH: 30.5 pg (ref 26.0–34.0)
MCHC: 34.9 g/dL (ref 30.0–36.0)
MCV: 87.4 fL (ref 80.0–100.0)
Platelets: 336 10*3/uL (ref 150–400)
RBC: 3.74 MIL/uL — ABNORMAL LOW (ref 3.87–5.11)
RDW: 12.4 % (ref 11.5–15.5)
WBC: 11.7 10*3/uL — ABNORMAL HIGH (ref 4.0–10.5)
nRBC: 0 % (ref 0.0–0.2)

## 2021-03-23 LAB — PROTEIN / CREATININE RATIO, URINE
Creatinine, Urine: 11.67 mg/dL
Total Protein, Urine: <6 mg/dL

## 2021-03-23 MED ORDER — LABETALOL HCL 5 MG/ML IV SOLN: 80.0000 mg | INTRAVENOUS | Status: DC | PRN

## 2021-03-23 MED ORDER — LABETALOL HCL 5 MG/ML IV SOLN: 40.0000 mg | INTRAVENOUS | Status: DC | PRN

## 2021-03-23 MED ORDER — BETAMETHASONE SOD PHOS & ACET 6 (3-3) MG/ML IJ SUSP
12.0000 mg | Freq: Once | INTRAMUSCULAR | Status: AC
  Administered 2021-03-23: 12 mg via INTRAMUSCULAR
  Filled 2021-03-23: qty 5

## 2021-03-23 MED ORDER — LABETALOL HCL 5 MG/ML IV SOLN: 20.0000 mg | INTRAVENOUS | Status: DC | PRN

## 2021-03-23 MED ORDER — HYDRALAZINE HCL 20 MG/ML IJ SOLN: 10.0000 mg | INTRAMUSCULAR | Status: DC | PRN

## 2021-03-23 NOTE — MAU Provider Note (Signed)
History     CSN: [id number]  Arrival date and time: 03/23/21 1901   Event Date/Time   First Provider Initiated Contact with Patient 03/23/21 2009      Chief Complaint  Patient presents with   Hypertension   Melanie Cooley is a 35 y.o. ;[id number] at [redacted]w[redacted]d who presents today with high blood pressure. At her visit on 03/15/2021 she had 13?/89 blood pressure. Otherwise no history of hypertension/chronic hypertension. She reports that  sometime within the last week, and after that visit she had one elevated blood pressure reading at home. She states that today she felt dizzy and took her blood pressure at home and it was >140/90. She denies any contractions, vaginal bleeding or LOF. She reports that fetal movement has seemed different today, but she is feeling regular fetal movement.   Hypertension This is a new problem. The current episode started in the past 7 days. The problem has been waxing and waning since onset. Associated symptoms include anxiety. Associated agents: pregnancy.   OB History     Gravida  3   Para      Term      Preterm      AB  2   Living  0      SAB  1   IAB  1   Ectopic      Multiple      Live Births              Past Medical History:  Diagnosis Date   Anxiety    Cancer ([place])    Chronic kidney disease     Past Surgical History:  Procedure Laterality Date   INDUCED ABORTION     NEPHRECTOMY Right    Partial due to cancer    History reviewed. No pertinent family history.  Social History   Tobacco Use   Smoking status: Never   Smokeless tobacco: Never  Substance Use Topics   Alcohol use: Never   Drug use: Never    Allergies:  Allergies  Allergen Reactions   Compazine [Prochlorperazine] Other (See Comments)    psychosis   Other     Pistachios, soy, barely, peanuts, peas  GI issues    Medications Prior to Admission  Medication Sig Dispense Refill Last Dose   acetaminophen (TYLENOL) 325 MG tablet Take 650 mg by mouth  every 6 (six) hours as needed for mild pain or headache.   Past Month   aspirin EC 81 MG tablet Take 81 mg by mouth daily. Swallow whole.   03/22/2021   bisacodyl (DULCOLAX) 10 MG suppository Place 10 mg rectally as needed for moderate constipation.   Past Month   Prenatal Vit-Fe Fumarate-FA (PRENATAL MULTIVITAMIN) TABS tablet Take 1 tablet by mouth daily at 12 noon.   03/22/2021   cabergoline (DOSTINEX) 0.5 MG tablet Take 0.5 mg by mouth daily.      cholecalciferol (VITAMIN D3) 25 MCG (1000 UNIT) tablet Take 1,000 Units by mouth daily.      Coenzyme Q10 (COQ10) 100 MG CAPS Take 50 mg by mouth daily.      Collagenase POWD Take 2 Scoops by mouth daily. Mix with water      OVER THE COUNTER MEDICATION Take 2 tablets by mouth daily. Usana vitamins      SPIRULINA PO Take 3 tablets by mouth daily.      traMADol (ULTRAM) 50 MG tablet Take 50 mg by mouth every 6 (six) hours as needed for pain.  Review of Systems  All other systems reviewed and are negative. Physical Exam   Blood pressure 133/76, pulse 100, temperature 98.7 F (37.1 C), resp. rate 18, height 5\' 6"  (1.676 m), weight 85.3 kg, last menstrual period 09/02/2020, SpO2 100 %, unknown if currently breastfeeding.  Physical Exam Constitutional:      Appearance: She is well-developed.  HENT:     Head: Normocephalic.  Eyes:     Pupils: Pupils are equal, round, and reactive to light.  Cardiovascular:     Rate and Rhythm: Normal rate and regular rhythm.     Heart sounds: Normal heart sounds.  Pulmonary:     Effort: Pulmonary effort is normal. No respiratory distress.     Breath sounds: Normal breath sounds.  Abdominal:     Palpations: Abdomen is soft.     Tenderness: There is no abdominal tenderness.  Genitourinary:    Vagina: No bleeding. Vaginal discharge: mucusy.    Comments: External: no lesion Vagina: small amount of white discharge     Musculoskeletal:        General: Normal range of motion.     Cervical back: Normal  range of motion and neck supple.  Skin:    General: Skin is warm and dry.  Neurological:     Mental Status: She is alert and oriented to person, place, and time.  Psychiatric:        Mood and Affect: Mood normal.        Behavior: Behavior normal.   NST:  Baseline: 145 Variability: moderate Accels: 15x15 Decels: none Toco: none Reactive/Appropriate for GA  Patient Vitals for the past 24 hrs:  BP Temp Pulse Resp SpO2 Height Weight  03/23/21 2046 133/76 -- 100 -- -- -- --  03/23/21 2031 (!) 144/81 -- (!) 106 -- -- -- --  03/23/21 2016 (!) 133/91 -- (!) 103 -- -- -- --  03/23/21 2008 (!) 148/88 -- (!) 104 -- -- -- --  03/23/21 1938 (!) 164/96 98.7 F (37.1 C) (!) 117 18 100 % 5\' 6"  (1.676 m) 85.3 kg    Results for orders placed or performed during the hospital encounter of 03/23/21 (from the past 24 hour(s))  CBC     Status: Abnormal   Collection Time: 03/23/21  7:48 PM  Result Value Ref Range   WBC 11.7 (H) 4.0 - 10.5 K/uL   RBC 3.74 (L) 3.87 - 5.11 MIL/uL   Hemoglobin 11.4 (L) 12.0 - 15.0 g/dL   HCT 32.7 (L) 36.0 - 46.0 %   MCV 87.4 80.0 - 100.0 fL   MCH 30.5 26.0 - 34.0 pg   MCHC 34.9 30.0 - 36.0 g/dL   RDW 12.4 11.5 - 15.5 %   Platelets 336 150 - 400 K/uL   nRBC 0.0 0.0 - 0.2 %  Comprehensive metabolic panel     Status: Abnormal   Collection Time: 03/23/21  7:48 PM  Result Value Ref Range   Sodium 134 (L) 135 - 145 mmol/L   Potassium 3.6 3.5 - 5.1 mmol/L   Chloride 105 98 - 111 mmol/L   CO2 22 22 - 32 mmol/L   Glucose, Bld 91 70 - 99 mg/dL   BUN 10 6 - 20 mg/dL   Creatinine, Ser 0.59 0.44 - 1.00 mg/dL   Calcium 9.2 8.9 - 10.3 mg/dL   Total Protein 6.7 6.5 - 8.1 g/dL   Albumin 3.1 (L) 3.5 - 5.0 g/dL   AST 24 15 - 41 U/L   ALT  18 0 - 44 U/L   Alkaline Phosphatase 69 38 - 126 U/L   Total Bilirubin 0.2 (L) 0.3 - 1.2 mg/dL   GFR, Estimated >60 >60 mL/min   Anion gap 7 5 - 15  Urinalysis, Routine w reflex microscopic Urine, Clean Catch     Status: Abnormal    Collection Time: 03/23/21  7:54 PM  Result Value Ref Range   Color, Urine YELLOW YELLOW   APPearance CLEAR CLEAR   Specific Gravity, Urine <1.005 (L) 1.005 - 1.030   pH 6.0 5.0 - 8.0   Glucose, UA NEGATIVE NEGATIVE mg/dL   Hgb urine dipstick NEGATIVE NEGATIVE   Bilirubin Urine NEGATIVE NEGATIVE   Ketones, ur NEGATIVE NEGATIVE mg/dL   Protein, ur NEGATIVE NEGATIVE mg/dL   Nitrite NEGATIVE NEGATIVE   Leukocytes,Ua NEGATIVE NEGATIVE  Protein / creatinine ratio, urine     Status: None   Collection Time: 03/23/21  7:56 PM  Result Value Ref Range   Creatinine, Urine 11.67 mg/dL   Total Protein, Urine <6 mg/dL   Protein Creatinine Ratio        0.00 - 0.15 mg/mg[Cre]     MAU Course  Procedures  MDM DW Dr. Harolyn Rutherford, patient to get BMZ today and return tomorrow for BMZ and blood pressure check.   Assessment and Plan   1. Hypertension during pregnancy in third trimester, unspecified hypertension in pregnancy type   2. [redacted] weeks gestation of pregnancy    DC home Comfort measures reviewed  1st/2nd/3rd Trimester precautions  Bleeding precautions Ectopic precautions PTL precautions  Fetal kick counts RX: Return to MAU as needed FU with OB as planned   Follow-up Information     [facility] Follow up.   Specialty: Obstetrics and Gynecology Why: return tomorrow night for 2nd betamethasone injection Contact information: [address] [id number] [place], CNM  03/23/21  9:36 PM

## 2021-03-23 NOTE — MAU Note (Addendum)
Last Sat felt dizzy and took b/p and it was elevated. Took nap and it was ok afterward. Was dizzy today and took b/p and it was high. I was stressed out today due to not feeling baby move like usual. Mild back pain. Pt states is anxious so may be part of everything. Hands, face, ankles puffy per pt. Denies LOF or VB. Some d/c. Labs drawn in Triage

## 2021-03-24 ENCOUNTER — Inpatient Hospital Stay (HOSPITAL_COMMUNITY)
Admission: AD | Admit: 2021-03-24 | Discharge: 2021-03-24 | Disposition: A | Discharge status: Home / Self Care | Payer: 59 | Attending: Obstetrics and Gynecology | Admitting: Obstetrics and Gynecology

## 2021-03-24 DIAGNOSIS — Z3493 Encounter for supervision of normal pregnancy, unspecified, third trimester: Secondary | ICD-10-CM | POA: Diagnosis present

## 2021-03-24 DIAGNOSIS — O2692 Pregnancy related conditions, unspecified, second trimester: Secondary | ICD-10-CM | POA: Diagnosis not present

## 2021-03-24 DIAGNOSIS — Z3A29 29 weeks gestation of pregnancy: Secondary | ICD-10-CM | POA: Diagnosis not present

## 2021-03-24 MED ORDER — BETAMETHASONE SOD PHOS & ACET 6 (3-3) MG/ML IJ SUSP
12.0000 mg | Freq: Once | INTRAMUSCULAR | Status: AC
  Administered 2021-03-24: 12 mg via INTRAMUSCULAR
  Filled 2021-03-24: qty 5

## 2021-03-24 NOTE — MAU Provider Note (Signed)
Event Date/Time   First Provider Initiated Contact with Patient 03/24/21 2145      S Ms. Melanie Cooley is a 35 y.o. G3P0020 patient who presents to MAU today for 2nd dose of BMZ. She denies contractions, leaking fluid, vaginal bleeding, headache, vision changes, or RUQ/epigastric pain. Endorses active fetal movement. Records reviewed.   O BP 136/86 (BP Location: Right Arm)   Pulse (!) 107   Temp 98 F (36.7 C) (Oral)   Resp 16   Ht 5\' 6"  (1.676 m)   Wt 86.1 kg   LMP 09/02/2020   SpO2 100%   BMI 30.63 kg/m   Physical Exam Vitals and nursing note reviewed.  Constitutional:      General: She is not in acute distress.    Appearance: She is normal weight.  Eyes:     Pupils: Pupils are equal, round, and reactive to light.  Cardiovascular:     Rate and Rhythm: Tachycardia present.  Pulmonary:     Effort: Pulmonary effort is normal.  Musculoskeletal:        General: Normal range of motion.  Skin:    General: Skin is warm and dry.  Neurological:     General: No focal deficit present.     Mental Status: She is alert and oriented to person, place, and time.  Psychiatric:        Mood and Affect: Mood normal.        Behavior: Behavior normal.        Thought Content: Thought content normal.        Judgment: Judgment normal.    A Medical screening exam complete [redacted] weeks gestation  P BMZ #2 given BP normotensive Discharge from MAU in stable condition Warning signs for worsening condition that would warrant emergency follow-up discussed Patient may return to MAU as needed    Renee Harder, CNM 03/24/2021 9:48 PM

## 2021-03-24 NOTE — MAU Note (Signed)
..  Melanie Cooley is a 35 y.o. at [redacted]w[redacted]d here in MAU reporting: reports for her second betamethasone injeciton. Denies cramping. +FM  Pain score: 0/10 Vitals:   03/24/21 2125  BP: 136/86  Pulse: (!) 107  Resp: 16  Temp: 98 F (36.7 C)  SpO2: 100%     FHT:149 Lab orders placed from triage : Betamethasone injection

## 2021-04-02 ENCOUNTER — Inpatient Hospital Stay (HOSPITAL_COMMUNITY)
Admission: AD | Admit: 2021-04-02 | Discharge: 2021-04-02 | Disposition: A | Discharge status: Home / Self Care | Payer: 59 | Attending: Obstetrics and Gynecology | Admitting: Obstetrics and Gynecology

## 2021-04-02 ENCOUNTER — Encounter (HOSPITAL_COMMUNITY): Payer: Self-pay | Admitting: Obstetrics and Gynecology

## 2021-04-02 ENCOUNTER — Other Ambulatory Visit: Payer: Self-pay

## 2021-04-02 DIAGNOSIS — Z3A30 30 weeks gestation of pregnancy: Secondary | ICD-10-CM | POA: Diagnosis not present

## 2021-04-02 DIAGNOSIS — O99340 Other mental disorders complicating pregnancy, unspecified trimester: Secondary | ICD-10-CM

## 2021-04-02 DIAGNOSIS — R109 Unspecified abdominal pain: Secondary | ICD-10-CM | POA: Insufficient documentation

## 2021-04-02 DIAGNOSIS — O26893 Other specified pregnancy related conditions, third trimester: Secondary | ICD-10-CM | POA: Diagnosis present

## 2021-04-02 DIAGNOSIS — O133 Gestational [pregnancy-induced] hypertension without significant proteinuria, third trimester: Secondary | ICD-10-CM | POA: Diagnosis not present

## 2021-04-02 DIAGNOSIS — O09523 Supervision of elderly multigravida, third trimester: Secondary | ICD-10-CM | POA: Insufficient documentation

## 2021-04-02 DIAGNOSIS — F419 Anxiety disorder, unspecified: Secondary | ICD-10-CM | POA: Diagnosis not present

## 2021-04-02 DIAGNOSIS — R42 Dizziness and giddiness: Secondary | ICD-10-CM | POA: Insufficient documentation

## 2021-04-02 DIAGNOSIS — O99343 Other mental disorders complicating pregnancy, third trimester: Secondary | ICD-10-CM | POA: Diagnosis not present

## 2021-04-02 DIAGNOSIS — Z7901 Long term (current) use of anticoagulants: Secondary | ICD-10-CM | POA: Diagnosis not present

## 2021-04-02 LAB — COMPREHENSIVE METABOLIC PANEL
ALT: 15 U/L (ref 0–44)
AST: 21 U/L (ref 15–41)
Albumin: 3 g/dL — ABNORMAL LOW (ref 3.5–5.0)
Alkaline Phosphatase: 79 U/L (ref 38–126)
Anion gap: 7 (ref 5–15)
BUN: 11 mg/dL (ref 6–20)
CO2: 21 mmol/L — ABNORMAL LOW (ref 22–32)
Calcium: 9.3 mg/dL (ref 8.9–10.3)
Chloride: 102 mmol/L (ref 98–111)
Creatinine, Ser: 0.6 mg/dL (ref 0.44–1.00)
GFR, Estimated: >60 mL/min (ref >60)
Glucose, Bld: 97 mg/dL (ref 70–99)
Potassium: 4.1 mmol/L (ref 3.5–5.1)
Sodium: 130 mmol/L — ABNORMAL LOW (ref 135–145)
Total Bilirubin: 0.5 mg/dL (ref 0.3–1.2)
Total Protein: 6.5 g/dL (ref 6.5–8.1)

## 2021-04-02 LAB — URINALYSIS, ROUTINE W REFLEX MICROSCOPIC
Bilirubin Urine: NEGATIVE
Glucose, UA: NEGATIVE mg/dL
Hgb urine dipstick: NEGATIVE
Ketones, ur: NEGATIVE mg/dL
Nitrite: NEGATIVE
Protein, ur: NEGATIVE mg/dL
Specific Gravity, Urine: <1.005 — ABNORMAL LOW (ref 1.005–1.030)
pH: 6.5 (ref 5.0–8.0)

## 2021-04-02 LAB — CBC
HCT: 33.9 % — ABNORMAL LOW (ref 36.0–46.0)
Hemoglobin: 11.5 g/dL — ABNORMAL LOW (ref 12.0–15.0)
MCH: 29.9 pg (ref 26.0–34.0)
MCHC: 33.9 g/dL (ref 30.0–36.0)
MCV: 88.1 fL (ref 80.0–100.0)
Platelets: 322 10*3/uL (ref 150–400)
RBC: 3.85 MIL/uL — ABNORMAL LOW (ref 3.87–5.11)
RDW: 12.6 % (ref 11.5–15.5)
WBC: 10.2 10*3/uL (ref 4.0–10.5)
nRBC: 0 % (ref 0.0–0.2)

## 2021-04-02 LAB — URINALYSIS, MICROSCOPIC (REFLEX)

## 2021-04-02 LAB — PROTEIN / CREATININE RATIO, URINE
Creatinine, Urine: 15.44 mg/dL
Total Protein, Urine: <6 mg/dL

## 2021-04-02 MED ORDER — SERTRALINE HCL 50 MG PO TABS: 25.0000 mg | ORAL_TABLET | Freq: Every day | ORAL | 0 refills | Status: DC

## 2021-04-02 MED ORDER — PROMETHAZINE HCL 25 MG PO TABS: 25.0000 mg | ORAL_TABLET | Freq: Four times a day (QID) | ORAL | 0 refills | Status: DC | PRN

## 2021-04-02 NOTE — MAU Note (Signed)
Melanie Cooley is a 35 y.o. at [redacted]w[redacted]d here in MAU reporting: this afternoon has been feeling foggy and dizzy. Was put on labetalol last week for HTN, is taking 50mg  BID. Checked BP around 1400 and it was 158/95 so she took an additional dose of labetalol. Rechecked BP around 1600 and it was 130/85. Then around 1730 it was 165/105, her fiance checked it manually but is not trained to do so, but states automatic cuff was similar. Also has been having RUQ pain for the past hour, but states now pain is gone. No headaches or visual changes. +FM. Pt states she has a lot of anxiety related to her BP.  Onset of complaint: today  Pain score: 0/10  Vitals:   04/02/21 1836  BP: (!) 148/99  Pulse: (!) 113  Resp: 16  Temp: 99.4 F (37.4 C)  SpO2: 98%     FHT:136  Lab orders placed from triage: UA

## 2021-04-02 NOTE — Discharge Instructions (Signed)

## 2021-04-02 NOTE — MAU Provider Note (Signed)
History     CSN: [id number]  Arrival date and time: 04/02/21 1814   Event Date/Time   First Provider Initiated Contact with Patient 04/02/21 1902      Chief Complaint  Patient presents with   Hypertension   Abdominal Pain   HPI Melanie Cooley is a 35 y.o. G3P0020 at [redacted]w[redacted]d who presents with hypertension. She states she felt dizzy this afternoon so she checked her BP and it was 150s/90s. She took 50mg  of labetalol around 1400 and states her blood pressure improved. She reports she had another episode of dizziness around 1800 and her blood pressure was severe range so she came in. She denies any headache, visual changes or epigastric pain. She denies any hx of hypertension but was started on 50mg  of Labetalol BID at 29 weeks. She reports a significant hx of anxiety that is feeling unmanageable at this point in the pregnancy. She denies any SI or HI. She denies any abdominal pain, vaginal bleeding or leaking. Reports normal fetal movement.   OB History     Gravida  3   Para      Term      Preterm      AB  2   Living  0      SAB  1   IAB  1   Ectopic      Multiple      Live Births              Past Medical History:  Diagnosis Date   Anxiety    Cancer ([place])    Chronic kidney disease     Past Surgical History:  Procedure Laterality Date   INDUCED ABORTION     NEPHRECTOMY Right    Partial due to cancer    History reviewed. No pertinent family history.  Social History   Tobacco Use   Smoking status: Never   Smokeless tobacco: Never  Substance Use Topics   Alcohol use: Never   Drug use: Never    Allergies:  Allergies  Allergen Reactions   Compazine [Prochlorperazine] Other (See Comments)    psychosis   Other     Pistachios, soy, barely, peanuts, peas  GI issues    Medications Prior to Admission  Medication Sig Dispense Refill Last Dose   aspirin EC 81 MG tablet Take 81 mg by mouth daily. Swallow whole.   04/02/2021   bisacodyl (DULCOLAX)  10 MG suppository Place 10 mg rectally as needed for moderate constipation.   Past Month   labetalol (NORMODYNE) 100 MG tablet Take 50 mg by mouth 2 (two) times daily.   04/02/2021   Prenatal Vit-Fe Fumarate-FA (PRENATAL MULTIVITAMIN) TABS tablet Take 1 tablet by mouth daily at 12 noon.   04/02/2021   acetaminophen (TYLENOL) 325 MG tablet Take 650 mg by mouth every 6 (six) hours as needed for mild pain or headache.      cholecalciferol (VITAMIN D3) 25 MCG (1000 UNIT) tablet Take 1,000 Units by mouth daily.      OVER THE COUNTER MEDICATION Take 2 tablets by mouth daily. Usana vitamins       Review of Systems  Constitutional: Negative.  Negative for fatigue and fever.  HENT: Negative.    Respiratory: Negative.  Negative for shortness of breath.   Cardiovascular: Negative.  Negative for chest pain.  Gastrointestinal: Negative.  Negative for abdominal pain, constipation, diarrhea, nausea and vomiting.  Genitourinary: Negative.  Negative for dysuria, vaginal bleeding and vaginal discharge.  Neurological:  Positive  for dizziness. Negative for headaches.  Physical Exam   Blood pressure 135/88, pulse (!) 101, temperature 99.4 F (37.4 C), temperature source Oral, resp. rate 16, last menstrual period 09/02/2020, SpO2 99 %, unknown if currently breastfeeding.  Patient Vitals for the past 24 hrs:  BP Temp Temp src Pulse Resp SpO2  04/02/21 2045 131/75 -- -- 91 -- --  04/02/21 2025 -- -- -- -- -- 98 %  04/02/21 1950 -- -- -- -- -- 97 %  04/02/21 1946 (!) 137/95 -- -- 96 -- --  04/02/21 1945 139/90 -- -- 88 -- 96 %  04/02/21 1915 136/83 -- -- 96 -- 98 %  04/02/21 1900 135/88 -- -- (!) 101 -- 99 %  04/02/21 1836 (!) 148/99 99.4 F (37.4 C) Oral (!) 113 16 98 %     Physical Exam Vitals and nursing note reviewed.  Constitutional:      General: She is not in acute distress.    Appearance: She is well-developed.  HENT:     Head: Normocephalic.  Eyes:     Pupils: Pupils are equal, round, and  reactive to light.  Cardiovascular:     Rate and Rhythm: Normal rate and regular rhythm.     Heart sounds: Normal heart sounds.  Pulmonary:     Effort: Pulmonary effort is normal. No respiratory distress.     Breath sounds: Normal breath sounds.  Abdominal:     General: Bowel sounds are normal. There is no distension.     Palpations: Abdomen is soft.     Tenderness: There is no abdominal tenderness.  Skin:    General: Skin is warm and dry.  Neurological:     Mental Status: She is alert and oriented to person, place, and time.     Motor: No abnormal muscle tone.     Coordination: Coordination normal.     Deep Tendon Reflexes: Reflexes are normal and symmetric. Reflexes normal.  Psychiatric:        Mood and Affect: Mood normal.        Behavior: Behavior normal.        Thought Content: Thought content normal.        Judgment: Judgment normal.   Fetal Tracing:  Baseline: 130 Variability: moderate Accels: 15x15 Decels: none  Toco: none   MAU Course  Procedures Results for orders placed or performed during the hospital encounter of 04/02/21 (from the past 24 hour(s))  Urinalysis, Routine w reflex microscopic Urine, Clean Catch     Status: Abnormal   Collection Time: 04/02/21  6:26 PM  Result Value Ref Range   Color, Urine YELLOW YELLOW   APPearance CLEAR CLEAR   Specific Gravity, Urine <1.005 (L) 1.005 - 1.030   pH 6.5 5.0 - 8.0   Glucose, UA NEGATIVE NEGATIVE mg/dL   Hgb urine dipstick NEGATIVE NEGATIVE   Bilirubin Urine NEGATIVE NEGATIVE   Ketones, ur NEGATIVE NEGATIVE mg/dL   Protein, ur NEGATIVE NEGATIVE mg/dL   Nitrite NEGATIVE NEGATIVE   Leukocytes,Ua SMALL (A) NEGATIVE  Protein / creatinine ratio, urine     Status: None   Collection Time: 04/02/21  6:26 PM  Result Value Ref Range   Creatinine, Urine 15.44 mg/dL   Total Protein, Urine <6 mg/dL   Protein Creatinine Ratio        0.00 - 0.15 mg/mg[Cre]  Urinalysis, Microscopic (reflex)     Status: Abnormal    Collection Time: 04/02/21  6:26 PM  Result Value Ref Range   RBC /  HPF 0-5 0 - 5 RBC/hpf   WBC, UA 0-5 0 - 5 WBC/hpf   Bacteria, UA RARE (A) NONE SEEN   Squamous Epithelial / LPF 0-5 0 - 5  CBC     Status: Abnormal   Collection Time: 04/02/21  6:45 PM  Result Value Ref Range   WBC 10.2 4.0 - 10.5 K/uL   RBC 3.85 (L) 3.87 - 5.11 MIL/uL   Hemoglobin 11.5 (L) 12.0 - 15.0 g/dL   HCT 33.9 (L) 36.0 - 46.0 %   MCV 88.1 80.0 - 100.0 fL   MCH 29.9 26.0 - 34.0 pg   MCHC 33.9 30.0 - 36.0 g/dL   RDW 12.6 11.5 - 15.5 %   Platelets 322 150 - 400 K/uL   nRBC 0.0 0.0 - 0.2 %  Comprehensive metabolic panel     Status: Abnormal   Collection Time: 04/02/21  6:45 PM  Result Value Ref Range   Sodium 130 (L) 135 - 145 mmol/L   Potassium 4.1 3.5 - 5.1 mmol/L   Chloride 102 98 - 111 mmol/L   CO2 21 (L) 22 - 32 mmol/L   Glucose, Bld 97 70 - 99 mg/dL   BUN 11 6 - 20 mg/dL   Creatinine, Ser 0.60 0.44 - 1.00 mg/dL   Calcium 9.3 8.9 - 10.3 mg/dL   Total Protein 6.5 6.5 - 8.1 g/dL   Albumin 3.0 (L) 3.5 - 5.0 g/dL   AST 21 15 - 41 U/L   ALT 15 0 - 44 U/L   Alkaline Phosphatase 79 38 - 126 U/L   Total Bilirubin 0.5 0.3 - 1.2 mg/dL   GFR, Estimated >60 >60 mL/min   Anion gap 7 5 - 15    MDM Prenatal records from private office reviewed. Pregnancy complicated by gestational hypertension. Labs ordered and reviewed.  UA CBC, CMP, Protein/creat ratio Lengthy discussion about preeclampsia and warning signs. Reassurance provided of normalcy of presentation today. Lengthy discussion of progression of pregnancy at this point and ongoing surveillance of pregnancy.   Long discussion regarding anxiety in pregnancy. Patient reports using several modalities to help with anxiety but now feels like the anxiety is out of control. She reports she has discussed medication management with Dr. Lanny Cramp at a prenatal visit and is now ready to start medication. Lengthy discussion of options and previously used medication.  Patient reports Xanax works the best but understands it is not the safest medication in pregnancy. Patient desires to try Zoloft and will make an appointment with established therapist. Also has appointment with acupuncture in the past.   Assessment and Plan   1. Gestational hypertension, third trimester   2. [redacted] weeks gestation of pregnancy   3. Anxiety during pregnancy    -Discharge home in stable condition -Rx for zoloft and phenergan sent to patient's pharmacy -Preeclampsia precautions discussed -Patient advised to follow-up with OB as scheduled on 12/27 for prenatal care -Patient may return to MAU as needed or if her condition were to change or worsen   Wende Mott CNM 04/02/2021, 7:02 PM

## 2021-04-11 ENCOUNTER — Other Ambulatory Visit: Payer: Self-pay | Admitting: Obstetrics and Gynecology

## 2021-04-11 DIAGNOSIS — Z363 Encounter for antenatal screening for malformations: Secondary | ICD-10-CM

## 2021-04-12 ENCOUNTER — Other Ambulatory Visit: Payer: Self-pay | Admitting: *Deleted

## 2021-04-12 ENCOUNTER — Other Ambulatory Visit: Payer: Self-pay | Admitting: Obstetrics and Gynecology

## 2021-04-12 ENCOUNTER — Ambulatory Visit: Payer: 59 | Attending: Obstetrics and Gynecology

## 2021-04-12 ENCOUNTER — Other Ambulatory Visit: Payer: Self-pay

## 2021-04-12 ENCOUNTER — Ambulatory Visit: Payer: 59 | Admitting: *Deleted

## 2021-04-12 ENCOUNTER — Ambulatory Visit (HOSPITAL_BASED_OUTPATIENT_CLINIC_OR_DEPARTMENT_OTHER): Payer: 59 | Admitting: Obstetrics

## 2021-04-12 VITALS — BP 124/75 | HR 99

## 2021-04-12 DIAGNOSIS — Z3A31 31 weeks gestation of pregnancy: Secondary | ICD-10-CM

## 2021-04-12 DIAGNOSIS — Z363 Encounter for antenatal screening for malformations: Secondary | ICD-10-CM

## 2021-04-12 DIAGNOSIS — O09523 Supervision of elderly multigravida, third trimester: Secondary | ICD-10-CM | POA: Insufficient documentation

## 2021-04-12 DIAGNOSIS — O365930 Maternal care for other known or suspected poor fetal growth, third trimester, not applicable or unspecified: Secondary | ICD-10-CM

## 2021-04-12 DIAGNOSIS — O365931 Maternal care for other known or suspected poor fetal growth, third trimester, fetus 1: Secondary | ICD-10-CM

## 2021-04-12 DIAGNOSIS — O133 Gestational [pregnancy-induced] hypertension without significant proteinuria, third trimester: Secondary | ICD-10-CM | POA: Diagnosis not present

## 2021-04-12 NOTE — Procedures (Signed)
Melanie Cooley 02/26/1986 [redacted]w[redacted]d  Fetus A Non-Stress Test Interpretation for 04/12/21  Indication: IUGR  Fetal Heart Rate A Mode: External Baseline Rate (A): 140 bpm Variability: Moderate Accelerations: 15 x 15 Decelerations: None Multiple birth?: No  Uterine Activity Mode: Palpation, Toco Contraction Frequency (min): none Resting Tone Palpated: Relaxed  Interpretation (Fetal Testing) Nonstress Test Interpretation: Reactive Overall Impression: Reassuring for gestational age Comments: Dr. Annamaria Boots reviewed tracing

## 2021-04-12 NOTE — Progress Notes (Signed)
MFM Note  Melanie Cooley was seen for an ultrasound exam and consultation today due to fetal growth restriction that was noted on a recent ultrasound exam performed in your office.  She reports that she was diagnosed with a subchorionic hematoma at approximately 6 weeks.  Her vaginal bleeding resolved at between 14 to 16 weeks.  She had a normal fetal anatomy scan performed earlier in her pregnancy.  She was diagnosed with gestational hypertension at around 29 weeks and was started on labetalol 100 mg twice a day for blood pressure control.  She received a complete course of antenatal corticosteroids at that time.  Her [place] labs were all within normal limits.  Due to the diagnosis of IUGR and gestational hypertension, the patient has been quite anxious and was prescribed Zoloft for treatment.  She had a cell free DNA test performed earlier in her pregnancy showing a low risk for trisomy 3, 18, and 13.  A female fetus is predicted.  On today's exam, the EFW (2lbs 13 oz) measures at less than the first percentile for her gestational age indicating severe fetal growth restriction.  There was normal amniotic fluid noted.  Doppler studies of the umbilical arteries showed an elevated S/D ratio of 4.23.  There were no signs of absent or reversed end-diastolic flow.    The views of the fetal anatomy were limited today due to her advanced gestational age.  A BPP performed today was 10 out of 10 with a reactive NST.  A small appearing anterior placenta with a possible posterior succenturiate lobe was noted on today's exam.  There were multiple echogenic areas noted throughout the placenta.  The patient was advised that these echogenic areas in the placenta may represent areas of infarction which may contribute to the IUGR noted today.  The implications and management of fetal growth restriction was discussed with the patient, her father, and her fianc.  They were advised regarding the increased risk of an  adverse pregnancy outcome such as stillbirth associated with IUGR, especially if the umbilical artery Doppler studies are abnormal.    Due to the increased risk of preeclampsia in pregnancies complicated by IUGR/placental dysfunction and gestational hypertension, she was advised to continue taking a daily baby aspirin (81 mg) daily for preeclampsia prophylaxis.    They were also reassured that fetal growth restriction is a common finding and that most cases result in the delivery of a healthy infant close to term.  Due to fetal growth restriction and her extreme anxiety, we will continue continue to follow her closely with twice-weekly NST/BPP and weekly umbilical artery Doppler studies.    As long as the umbilical artery Doppler studies continue to show diastolic flow, the BPP/NSTs remains reassuring, and the fetus continues to show growth, we will try to delay delivery until 34 to 37 weeks.  Fetal kick count instructions were reviewed.  Due to her extreme anxiety, she was advised to start taking the Zoloft that was prescribed by the psychiatrist.  At the end of the consultation, the patient stated that all of her questions have been answered to her complete satisfaction.  She reports that she felt much better following today's visit.  A total of 60 minutes was spent counseling and coordinating the care for this patient.  Greater than 50% of the time was spent in direct face-to-face contact.    Recommendations:  Continue labetalol for blood pressure control Continue daily baby aspirin for preeclampsia prophylaxis Twice weekly NST/BPP's Weekly umbilical artery Doppler  studies Goal for delivery would be at between 34 to 37 weeks depending on fetal status, her blood pressures, her umbilical artery Doppler studies, and the fetal growth

## 2021-04-13 ENCOUNTER — Telehealth: Payer: Self-pay

## 2021-04-13 NOTE — Telephone Encounter (Signed)
mar/sw patient-advised of upcoming MFM appointments.

## 2021-04-15 ENCOUNTER — Inpatient Hospital Stay (HOSPITAL_COMMUNITY)
Admission: AD | Admit: 2021-04-15 | Discharge: 2021-04-15 | Disposition: A | Discharge status: Home / Self Care | Payer: 59 | Attending: Obstetrics and Gynecology | Admitting: Obstetrics and Gynecology

## 2021-04-15 ENCOUNTER — Other Ambulatory Visit: Payer: Self-pay

## 2021-04-15 ENCOUNTER — Encounter (HOSPITAL_COMMUNITY): Payer: Self-pay | Admitting: Obstetrics and Gynecology

## 2021-04-15 DIAGNOSIS — Z3A33 33 weeks gestation of pregnancy: Secondary | ICD-10-CM | POA: Diagnosis not present

## 2021-04-15 DIAGNOSIS — N926 Irregular menstruation, unspecified: Secondary | ICD-10-CM | POA: Insufficient documentation

## 2021-04-15 DIAGNOSIS — Z3A32 32 weeks gestation of pregnancy: Secondary | ICD-10-CM

## 2021-04-15 DIAGNOSIS — J4520 Mild intermittent asthma, uncomplicated: Secondary | ICD-10-CM | POA: Insufficient documentation

## 2021-04-15 DIAGNOSIS — O133 Gestational [pregnancy-induced] hypertension without significant proteinuria, third trimester: Secondary | ICD-10-CM | POA: Diagnosis not present

## 2021-04-15 DIAGNOSIS — C649 Malignant neoplasm of unspecified kidney, except renal pelvis: Secondary | ICD-10-CM | POA: Insufficient documentation

## 2021-04-15 DIAGNOSIS — Z3689 Encounter for other specified antenatal screening: Secondary | ICD-10-CM

## 2021-04-15 DIAGNOSIS — E7800 Pure hypercholesterolemia, unspecified: Secondary | ICD-10-CM | POA: Insufficient documentation

## 2021-04-15 DIAGNOSIS — O368130 Decreased fetal movements, third trimester, not applicable or unspecified: Secondary | ICD-10-CM | POA: Diagnosis present

## 2021-04-15 NOTE — MAU Note (Addendum)
Pt stated the last time she felt baby move was 830pm. she had a doppler at home, she listened and heard a "weird" sound. Now feeling baby move.

## 2021-04-15 NOTE — MAU Provider Note (Signed)
History   [id number]   Chief Complaint  Patient presents with   Decreased Fetal Movement    HPI Melanie Cooley is a 35 y.o. female  G3P0020 here with report of decreased fetal movement since 830 pm.  Reports feeling the baby move approximately 0 times in the last 2 hours until arriving to MAU. Has a home doppler & heard something "weird" when tried to use it this evening. Denies contractions, vaginal bleeding, or leaking of fluid. Goes to [facility] for prenatal care.   Patient's last menstrual period was 09/02/2020.  OB History  Gravida Para Term Preterm AB Living  3       2 0  SAB IAB Ectopic Multiple Live Births  1 1          # Outcome Date GA Lbr Len/2nd Weight Sex Delivery Anes PTL Lv  3 Current           2 IAB           1 SAB             Past Medical History:  Diagnosis Date   Anxiety    Cancer ([place])    Chronic kidney disease     History reviewed. No pertinent family history.  Social History   Socioeconomic History   Marital status: Married    Spouse name: Not on file   Number of children: Not on file   Years of education: Not on file   Highest education level: Not on file  Occupational History   Not on file  Tobacco Use   Smoking status: Never   Smokeless tobacco: Never  Vaping Use   Vaping Use: Never used  Substance and Sexual Activity   Alcohol use: Never   Drug use: Never   Sexual activity: Yes  Other Topics Concern   Not on file  Social History Narrative   Not on file   Social Determinants of Health   Financial Resource Strain: Not on file  Food Insecurity: Not on file  Transportation Needs: Not on file  Physical Activity: Not on file  Stress: Not on file  Social Connections: Not on file    Allergies  Allergen Reactions   Compazine [Prochlorperazine] Other (See Comments)    psychosis   Other     Pistachios, soy, barely, peanuts, peas  GI issues    No current facility-administered medications on file prior to encounter.    Current Outpatient Medications on File Prior to Encounter  Medication Sig Dispense Refill   acetaminophen (TYLENOL) 325 MG tablet Take 650 mg by mouth every 6 (six) hours as needed for mild pain or headache.     aspirin EC 81 MG tablet Take 81 mg by mouth daily. Swallow whole.     cholecalciferol (VITAMIN D3) 25 MCG (1000 UNIT) tablet Take 1,000 Units by mouth daily.     labetalol (NORMODYNE) 100 MG tablet Take 50 mg by mouth 2 (two) times daily.     Prenatal Vit-Fe Fumarate-FA (PRENATAL MULTIVITAMIN) TABS tablet Take 1 tablet by mouth daily at 12 noon.     sertraline (ZOLOFT) 50 MG tablet Take 0.5 tablets (25 mg total) by mouth daily. 30 tablet 0     Review of Systems  Constitutional: Negative.   Gastrointestinal: Negative.   Genitourinary: Negative.     Physical Exam   Vitals:   04/15/21 2232  BP: 139/87  Pulse: 96  Resp: 17  Temp: 98.3 F (36.8 C)  TempSrc: Oral  Physical Exam Vitals and nursing note reviewed.  Constitutional:      General: She is not in acute distress.    Appearance: Normal appearance.  Eyes:     General: No scleral icterus.    Conjunctiva/sclera: Conjunctivae normal.  Pulmonary:     Effort: Pulmonary effort is normal. No respiratory distress.  Neurological:     General: No focal deficit present.     Mental Status: She is alert.  Psychiatric:        Mood and Affect: Mood normal.        Behavior: Behavior normal.     NST:  Baseline: 130 bpm, Variability: Good {> 6 bpm), Accelerations: Reactive, and Decelerations: Absent  MAU Course  Procedures  MDM Reactive NST in MAU. Had 10/10 BPP with MFM on Wednesday.  Patient documented 22 fetal movements in 50 minutes of monitoring.    Assessment and Plan   1. NST (non-stress test) reactive   2. Decreased fetal movements in third trimester, single or unspecified fetus   3. [redacted] weeks gestation of pregnancy    -reviewed reasons to return to MAU -keep scheduled MFM appt on  Tuesday   Jorje Guild, NP 04/15/2021 10:43 PM

## 2021-04-16 DIAGNOSIS — O1490 Unspecified pre-eclampsia, unspecified trimester: Secondary | ICD-10-CM

## 2021-04-16 HISTORY — DX: Unspecified pre-eclampsia, unspecified trimester: O14.90

## 2021-04-18 ENCOUNTER — Ambulatory Visit: Payer: Commercial Managed Care - HMO | Admitting: *Deleted

## 2021-04-18 ENCOUNTER — Ambulatory Visit: Payer: Commercial Managed Care - HMO | Attending: Obstetrics

## 2021-04-18 ENCOUNTER — Encounter: Payer: Self-pay | Admitting: *Deleted

## 2021-04-18 ENCOUNTER — Other Ambulatory Visit: Payer: Self-pay

## 2021-04-18 VITALS — BP 132/85 | HR 102

## 2021-04-18 DIAGNOSIS — O09523 Supervision of elderly multigravida, third trimester: Secondary | ICD-10-CM

## 2021-04-18 DIAGNOSIS — I10 Essential (primary) hypertension: Secondary | ICD-10-CM | POA: Insufficient documentation

## 2021-04-18 DIAGNOSIS — O365931 Maternal care for other known or suspected poor fetal growth, third trimester, fetus 1: Secondary | ICD-10-CM | POA: Insufficient documentation

## 2021-04-18 DIAGNOSIS — Z3A32 32 weeks gestation of pregnancy: Secondary | ICD-10-CM

## 2021-04-18 DIAGNOSIS — O365930 Maternal care for other known or suspected poor fetal growth, third trimester, not applicable or unspecified: Secondary | ICD-10-CM | POA: Diagnosis not present

## 2021-04-18 DIAGNOSIS — O133 Gestational [pregnancy-induced] hypertension without significant proteinuria, third trimester: Secondary | ICD-10-CM

## 2021-04-20 ENCOUNTER — Other Ambulatory Visit: Payer: 59

## 2021-04-20 ENCOUNTER — Ambulatory Visit: Payer: 59

## 2021-04-21 ENCOUNTER — Encounter (HOSPITAL_COMMUNITY): Payer: Self-pay | Admitting: Obstetrics and Gynecology

## 2021-04-21 ENCOUNTER — Ambulatory Visit: Payer: Managed Care, Other (non HMO) | Attending: Obstetrics | Admitting: *Deleted

## 2021-04-21 ENCOUNTER — Other Ambulatory Visit: Payer: Self-pay

## 2021-04-21 ENCOUNTER — Ambulatory Visit: Payer: Managed Care, Other (non HMO) | Admitting: *Deleted

## 2021-04-21 ENCOUNTER — Inpatient Hospital Stay (HOSPITAL_COMMUNITY)
Admission: AD | Admit: 2021-04-21 | Discharge: 2021-04-21 | Disposition: A | Discharge status: Home / Self Care | Payer: Commercial Managed Care - HMO | Attending: Obstetrics and Gynecology | Admitting: Obstetrics and Gynecology

## 2021-04-21 DIAGNOSIS — Z3689 Encounter for other specified antenatal screening: Secondary | ICD-10-CM | POA: Insufficient documentation

## 2021-04-21 DIAGNOSIS — O365931 Maternal care for other known or suspected poor fetal growth, third trimester, fetus 1: Secondary | ICD-10-CM

## 2021-04-21 DIAGNOSIS — O368130 Decreased fetal movements, third trimester, not applicable or unspecified: Secondary | ICD-10-CM

## 2021-04-21 DIAGNOSIS — Z3A33 33 weeks gestation of pregnancy: Secondary | ICD-10-CM | POA: Diagnosis not present

## 2021-04-21 DIAGNOSIS — O133 Gestational [pregnancy-induced] hypertension without significant proteinuria, third trimester: Secondary | ICD-10-CM

## 2021-04-21 DIAGNOSIS — O365930 Maternal care for other known or suspected poor fetal growth, third trimester, not applicable or unspecified: Secondary | ICD-10-CM

## 2021-04-21 LAB — COMPREHENSIVE METABOLIC PANEL
ALT: 12 U/L (ref 0–44)
AST: 23 U/L (ref 15–41)
Albumin: 2.8 g/dL — ABNORMAL LOW (ref 3.5–5.0)
Alkaline Phosphatase: 96 U/L (ref 38–126)
Anion gap: 8 (ref 5–15)
BUN: 11 mg/dL (ref 6–20)
CO2: 20 mmol/L — ABNORMAL LOW (ref 22–32)
Calcium: 9.3 mg/dL (ref 8.9–10.3)
Chloride: 104 mmol/L (ref 98–111)
Creatinine, Ser: 0.59 mg/dL (ref 0.44–1.00)
GFR, Estimated: >60 mL/min (ref >60)
Glucose, Bld: 91 mg/dL (ref 70–99)
Potassium: 3.9 mmol/L (ref 3.5–5.1)
Sodium: 132 mmol/L — ABNORMAL LOW (ref 135–145)
Total Bilirubin: <0.1 mg/dL — ABNORMAL LOW (ref 0.3–1.2)
Total Protein: 6.1 g/dL — ABNORMAL LOW (ref 6.5–8.1)

## 2021-04-21 LAB — CBC
HCT: 34.3 % — ABNORMAL LOW (ref 36.0–46.0)
Hemoglobin: 11.6 g/dL — ABNORMAL LOW (ref 12.0–15.0)
MCH: 29.8 pg (ref 26.0–34.0)
MCHC: 33.8 g/dL (ref 30.0–36.0)
MCV: 88.2 fL (ref 80.0–100.0)
Platelets: 309 10*3/uL (ref 150–400)
RBC: 3.89 MIL/uL (ref 3.87–5.11)
RDW: 12.6 % (ref 11.5–15.5)
WBC: 12.8 10*3/uL — ABNORMAL HIGH (ref 4.0–10.5)
nRBC: 0 % (ref 0.0–0.2)

## 2021-04-21 LAB — PROTEIN / CREATININE RATIO, URINE
Creatinine, Urine: 76.65 mg/dL
Protein Creatinine Ratio: 0.12 mg/mg{Cre} (ref 0.00–0.15)
Total Protein, Urine: 9 mg/dL

## 2021-04-21 NOTE — MAU Note (Signed)
Pt reports having a high risk pregnancy due to IUGR and some elevated b/ps. Has NSTs and u/s twice weekly and is just really nervous. Today has not felt baby move like normal since about lunch time. Has appt in AM but was too concerned to wait. Denies VB or LOF

## 2021-04-21 NOTE — Progress Notes (Signed)
Pt informed nurse that she had an NST last night at MAU. Dr Donalee Citrin didn't think she needed another one this morning as last nights was reactive. He is talking with patient now. Will return next week for NST.

## 2021-04-21 NOTE — Progress Notes (Signed)
Pt informed nurse that she had an NST last night at MAU. Dr Donalee Citrin reviewed the note and since it was reactive, she did not need another NST today. Will return next Tuesday.for NST.

## 2021-04-21 NOTE — MAU Provider Note (Signed)
History     CSN: [id number]  Arrival date and time: 04/21/21 0112   Event Date/Time   First Provider Initiated Contact with Patient 04/21/21 0154      Chief Complaint  Patient presents with   Decreased Fetal Movement   36 y.o. G3P0020 @33 .0 wks presenting with decreased FM. Reports less FM yesterday, "he was lagging". States no FM in last 3 hrs. Her pregnancy is complicated by gHTN and severe FGR. She is taking Labetalol bid. Denies HA, visual disturbances, RUQ pain, SOB, and CP.     OB History     Gravida  3   Para      Term      Preterm      AB  2   Living  0      SAB  1   IAB  1   Ectopic      Multiple      Live Births              Past Medical History:  Diagnosis Date   Anxiety    Cancer ([place])    Chronic kidney disease     Past Surgical History:  Procedure Laterality Date   INDUCED ABORTION     NEPHRECTOMY Right    Partial due to cancer    History reviewed. No pertinent family history.  Social History   Tobacco Use   Smoking status: Never   Smokeless tobacco: Never  Vaping Use   Vaping Use: Never used  Substance Use Topics   Alcohol use: Never   Drug use: Never    Allergies:  Allergies  Allergen Reactions   Compazine [Prochlorperazine] Other (See Comments)    psychosis   Other     Pistachios, soy, barely, peanuts, peas  GI issues    No medications prior to admission.    Review of Systems Physical Exam   Blood pressure 125/74, pulse 89, temperature 98.2 F (36.8 C), resp. rate 18, height 5\' 6"  (1.676 m), weight 87.1 kg, last menstrual period 09/02/2020, SpO2 98 %, unknown if currently breastfeeding. Patient Vitals for the past 24 hrs:  BP Temp Pulse Resp SpO2 Height Weight  04/21/21 0300 125/74 -- 89 -- -- -- --  04/21/21 0230 134/84 -- 87 -- 98 % -- --  04/21/21 0215 140/89 -- 85 -- -- -- --  04/21/21 0200 -- -- -- -- 99 % -- --  04/21/21 0145 (!) 140/105 -- 88 -- 99 % -- --  04/21/21 0138 (!) 135/94 -- 91 18  100 % -- --  04/21/21 0124 (!) 136/91 -- -- -- -- -- --  04/21/21 0119 -- 98.2 F (36.8 C) 94 17 98 % 5\' 6"  (1.676 m) 87.1 kg    Physical Exam Vitals and nursing note reviewed.  Constitutional:      General: She is not in acute distress.    Appearance: Normal appearance.  HENT:     Head: Normocephalic and atraumatic.  Cardiovascular:     Rate and Rhythm: Normal rate.  Pulmonary:     Effort: Pulmonary effort is normal. No respiratory distress.  Musculoskeletal:        General: Normal range of motion.     Cervical back: Normal range of motion.  Neurological:     General: No focal deficit present.     Mental Status: She is alert and oriented to person, place, and time.  Psychiatric:        Mood and Affect: Mood is anxious.  EFM: 130 bpm, mod variability, + accels, no decels Toco: none  Results for orders placed or performed during the hospital encounter of 04/21/21 (from the past 24 hour(s))  Protein / creatinine ratio, urine     Status: None   Collection Time: 04/21/21  1:25 AM  Result Value Ref Range   Creatinine, Urine 76.65 mg/dL   Total Protein, Urine 9 mg/dL   Protein Creatinine Ratio 0.12 0.00 - 0.15 mg/mg[Cre]  Comprehensive metabolic panel     Status: Abnormal   Collection Time: 04/21/21  2:11 AM  Result Value Ref Range   Sodium 132 (L) 135 - 145 mmol/L   Potassium 3.9 3.5 - 5.1 mmol/L   Chloride 104 98 - 111 mmol/L   CO2 20 (L) 22 - 32 mmol/L   Glucose, Bld 91 70 - 99 mg/dL   BUN 11 6 - 20 mg/dL   Creatinine, Ser 0.59 0.44 - 1.00 mg/dL   Calcium 9.3 8.9 - 10.3 mg/dL   Total Protein 6.1 (L) 6.5 - 8.1 g/dL   Albumin 2.8 (L) 3.5 - 5.0 g/dL   AST 23 15 - 41 U/L   ALT 12 0 - 44 U/L   Alkaline Phosphatase 96 38 - 126 U/L   Total Bilirubin <0.1 (L) 0.3 - 1.2 mg/dL   GFR, Estimated >60 >60 mL/min   Anion gap 8 5 - 15  CBC     Status: Abnormal   Collection Time: 04/21/21  2:11 AM  Result Value Ref Range   WBC 12.8 (H) 4.0 - 10.5 K/uL   RBC 3.89 3.87 - 5.11  MIL/uL   Hemoglobin 11.6 (L) 12.0 - 15.0 g/dL   HCT 34.3 (L) 36.0 - 46.0 %   MCV 88.2 80.0 - 100.0 fL   MCH 29.8 26.0 - 34.0 pg   MCHC 33.8 30.0 - 36.0 g/dL   RDW 12.6 11.5 - 15.5 %   Platelets 309 150 - 400 K/uL   nRBC 0.0 0.0 - 0.2 %   MAU Course  Procedures  MDM Prenatal records reviewed: pregnancy complicated by hx of renal CA, gHTN, severe FGR, AMA, marginal CI. Labs ordered and reviewed. Feeling good FM after EFM applied, >37 movements marked in the first hr and NST reactive. No signs of worsening HTN or PEC. Stable for discharge home.   Assessment and Plan   1. [redacted] weeks gestation of pregnancy   2. NST (non-stress test) reactive   3. Poor fetal growth affecting management of mother in third trimester, single or unspecified fetus   4. Gestational hypertension, third trimester    Discharge home Follow up at [facility] as scheduled Follow up at MFM today as scheduled PEC precautions FMCs  Allergies as of 04/21/2021       Reactions   Compazine [prochlorperazine] Other (See Comments)   psychosis   Other    Pistachios, soy, barely, peanuts, peas GI issues        Medication List     TAKE these medications    acetaminophen 325 MG tablet Commonly known as: TYLENOL Take 650 mg by mouth every 6 (six) hours as needed for mild pain or headache.   aspirin EC 81 MG tablet Take 81 mg by mouth daily. Swallow whole.   cholecalciferol 25 MCG (1000 UNIT) tablet Commonly known as: VITAMIN D3 Take 1,000 Units by mouth daily.   labetalol 100 MG tablet Commonly known as: NORMODYNE Take 50 mg by mouth 2 (two) times daily.   prenatal multivitamin Tabs tablet  Take 1 tablet by mouth daily at 12 noon.   sertraline 50 MG tablet Commonly known as: ZOLOFT Take 0.5 tablets (25 mg total) by mouth daily.       Julianne Handler, CNM 04/21/2021, 3:22 AM

## 2021-04-23 ENCOUNTER — Encounter (HOSPITAL_COMMUNITY): Payer: Self-pay | Admitting: Obstetrics & Gynecology

## 2021-04-23 ENCOUNTER — Other Ambulatory Visit: Payer: Self-pay

## 2021-04-23 ENCOUNTER — Inpatient Hospital Stay (EMERGENCY_DEPARTMENT_HOSPITAL)
Admission: AD | Admit: 2021-04-23 | Discharge: 2021-04-23 | Disposition: A | Discharge status: Home / Self Care | Payer: Commercial Managed Care - HMO | Source: Home / Self Care | Attending: Obstetrics & Gynecology | Admitting: Obstetrics & Gynecology

## 2021-04-23 DIAGNOSIS — R040 Epistaxis: Secondary | ICD-10-CM | POA: Insufficient documentation

## 2021-04-23 DIAGNOSIS — O368190 Decreased fetal movements, unspecified trimester, not applicable or unspecified: Secondary | ICD-10-CM | POA: Diagnosis not present

## 2021-04-23 DIAGNOSIS — Z3A33 33 weeks gestation of pregnancy: Secondary | ICD-10-CM | POA: Insufficient documentation

## 2021-04-23 DIAGNOSIS — Z79899 Other long term (current) drug therapy: Secondary | ICD-10-CM | POA: Insufficient documentation

## 2021-04-23 DIAGNOSIS — N189 Chronic kidney disease, unspecified: Secondary | ICD-10-CM | POA: Insufficient documentation

## 2021-04-23 DIAGNOSIS — Z3689 Encounter for other specified antenatal screening: Secondary | ICD-10-CM | POA: Insufficient documentation

## 2021-04-23 DIAGNOSIS — O26893 Other specified pregnancy related conditions, third trimester: Secondary | ICD-10-CM | POA: Insufficient documentation

## 2021-04-23 DIAGNOSIS — O26833 Pregnancy related renal disease, third trimester: Secondary | ICD-10-CM | POA: Insufficient documentation

## 2021-04-23 DIAGNOSIS — O10213 Pre-existing hypertensive chronic kidney disease complicating pregnancy, third trimester: Secondary | ICD-10-CM | POA: Insufficient documentation

## 2021-04-23 DIAGNOSIS — O133 Gestational [pregnancy-induced] hypertension without significant proteinuria, third trimester: Secondary | ICD-10-CM

## 2021-04-23 DIAGNOSIS — I129 Hypertensive chronic kidney disease with stage 1 through stage 4 chronic kidney disease, or unspecified chronic kidney disease: Secondary | ICD-10-CM | POA: Insufficient documentation

## 2021-04-23 DIAGNOSIS — O09523 Supervision of elderly multigravida, third trimester: Secondary | ICD-10-CM | POA: Insufficient documentation

## 2021-04-23 DIAGNOSIS — O368130 Decreased fetal movements, third trimester, not applicable or unspecified: Secondary | ICD-10-CM | POA: Diagnosis not present

## 2021-04-23 DIAGNOSIS — R110 Nausea: Secondary | ICD-10-CM | POA: Insufficient documentation

## 2021-04-23 DIAGNOSIS — R0789 Other chest pain: Secondary | ICD-10-CM | POA: Insufficient documentation

## 2021-04-23 DIAGNOSIS — O365930 Maternal care for other known or suspected poor fetal growth, third trimester, not applicable or unspecified: Secondary | ICD-10-CM | POA: Insufficient documentation

## 2021-04-23 DIAGNOSIS — R062 Wheezing: Secondary | ICD-10-CM | POA: Insufficient documentation

## 2021-04-23 LAB — URINALYSIS, MICROSCOPIC (REFLEX)

## 2021-04-23 LAB — CBC
HCT: 33.9 % — ABNORMAL LOW (ref 36.0–46.0)
Hemoglobin: 11.2 g/dL — ABNORMAL LOW (ref 12.0–15.0)
MCH: 29.4 pg (ref 26.0–34.0)
MCHC: 33 g/dL (ref 30.0–36.0)
MCV: 89 fL (ref 80.0–100.0)
Platelets: 292 10*3/uL (ref 150–400)
RBC: 3.81 MIL/uL — ABNORMAL LOW (ref 3.87–5.11)
RDW: 12.9 % (ref 11.5–15.5)
WBC: 11.2 10*3/uL — ABNORMAL HIGH (ref 4.0–10.5)
nRBC: 0 % (ref 0.0–0.2)

## 2021-04-23 LAB — URINALYSIS, ROUTINE W REFLEX MICROSCOPIC
Bilirubin Urine: NEGATIVE
Glucose, UA: NEGATIVE mg/dL
Hgb urine dipstick: NEGATIVE
Ketones, ur: NEGATIVE mg/dL
Nitrite: NEGATIVE
Protein, ur: NEGATIVE mg/dL
Specific Gravity, Urine: 1.01 (ref 1.005–1.030)
pH: 7 (ref 5.0–8.0)

## 2021-04-23 LAB — COMPREHENSIVE METABOLIC PANEL
ALT: 15 U/L (ref 0–44)
AST: 20 U/L (ref 15–41)
Albumin: 2.7 g/dL — ABNORMAL LOW (ref 3.5–5.0)
Alkaline Phosphatase: 90 U/L (ref 38–126)
Anion gap: 9 (ref 5–15)
BUN: 11 mg/dL (ref 6–20)
CO2: 20 mmol/L — ABNORMAL LOW (ref 22–32)
Calcium: 8.9 mg/dL (ref 8.9–10.3)
Chloride: 104 mmol/L (ref 98–111)
Creatinine, Ser: 0.55 mg/dL (ref 0.44–1.00)
GFR, Estimated: >60 mL/min (ref >60)
Glucose, Bld: 94 mg/dL (ref 70–99)
Potassium: 4.3 mmol/L (ref 3.5–5.1)
Sodium: 133 mmol/L — ABNORMAL LOW (ref 135–145)
Total Bilirubin: 0.3 mg/dL (ref 0.3–1.2)
Total Protein: 6 g/dL — ABNORMAL LOW (ref 6.5–8.1)

## 2021-04-23 LAB — PROTEIN / CREATININE RATIO, URINE
Creatinine, Urine: 59.25 mg/dL
Protein Creatinine Ratio: 0.12 mg/mg{Cre} (ref 0.00–0.15)
Total Protein, Urine: 7 mg/dL

## 2021-04-23 NOTE — MAU Note (Signed)
Patient arrived to MAU complaining of elevated BP since friday night. She stated that she called the on call doctor yesterday and they increased her labetalol to 300 mg/ day from 200 mg. Patient c/o chest tightness and wheezing, dizziness, swelling, Nose bleeds, nausea, and gassy. Patient also stated that she has 3-4 loose stool yesterday.  Patient denies vaginal bleeding, leakage of fluid and or contractions. + FM reported.

## 2021-04-23 NOTE — MAU Provider Note (Addendum)
Chief Complaint:  Hypertension, Chest Pain, Leg Swelling, Epistaxis, Nausea, and GI Problem (dizziness)   Event Date/Time   First Provider Initiated Contact with Patient 04/23/21 0911      HPI: Melanie Cooley is a 36 y.o. G3P0020 at [redacted]w[redacted]d with GHTN and FGR <1%tile who presents to maternity admissions reporting elevated BP at home x 2 days, 140s/90s-100s with new onset chest tightness and wheezing, dizziness, swelling, nosebleeds, nausea, and feeling gassy. She had 3-4 loose stools yesterday.  She is taking labetalol 100 mg TID as prescribed, this was increased 2 days ago when she called the on call nurse for her OB office.  She reports some black spots as floaters in her field of vision while in MAU only. There is no h/a or epigastric pain. There are no other symptoms.  She reports good fetal movement.   HPI  Past Medical History: Past Medical History:  Diagnosis Date   Anxiety    Cancer ([place])    Chronic kidney disease     Past obstetric history: OB History  Gravida Para Term Preterm AB Living  3       2 0  SAB IAB Ectopic Multiple Live Births  1 1          # Outcome Date GA Lbr Len/2nd Weight Sex Delivery Anes PTL Lv  3 Current           2 IAB           1 SAB             Past Surgical History: Past Surgical History:  Procedure Laterality Date   INDUCED ABORTION     NEPHRECTOMY Right    Partial due to cancer    Family History: History reviewed. No pertinent family history.  Social History: Social History   Tobacco Use   Smoking status: Never   Smokeless tobacco: Never  Vaping Use   Vaping Use: Never used  Substance Use Topics   Alcohol use: Never   Drug use: Never    Allergies:  Allergies  Allergen Reactions   Compazine [Prochlorperazine] Other (See Comments)    psychosis   Other     Pistachios, soy, barely, peanuts, peas  GI issues    Meds:  Medications Prior to Admission  Medication Sig Dispense Refill Last Dose   albuterol (VENTOLIN HFA) 108 (90  Base) MCG/ACT inhaler Inhale into the lungs every 6 (six) hours as needed for wheezing or shortness of breath.      acetaminophen (TYLENOL) 325 MG tablet Take 650 mg by mouth every 6 (six) hours as needed for mild pain or headache.      aspirin EC 81 MG tablet Take 81 mg by mouth daily. Swallow whole.      cholecalciferol (VITAMIN D3) 25 MCG (1000 UNIT) tablet Take 1,000 Units by mouth daily.      labetalol (NORMODYNE) 100 MG tablet Take 100 mg by mouth 3 (three) times daily. Medication increased to 300mg  a day as per patient      Prenatal Vit-Fe Fumarate-FA (PRENATAL MULTIVITAMIN) TABS tablet Take 1 tablet by mouth daily at 12 noon.      sertraline (ZOLOFT) 50 MG tablet Take 0.5 tablets (25 mg total) by mouth daily. 30 tablet 0     ROS:  Review of Systems  Constitutional:  Negative for chills, fatigue and fever.  HENT:         Nosebleeds   Eyes:  Positive for visual disturbance.  Respiratory:  Positive  for chest tightness and wheezing. Negative for shortness of breath.   Cardiovascular:  Negative for chest pain.  Gastrointestinal:  Positive for nausea. Negative for abdominal pain.  Genitourinary:  Negative for difficulty urinating, dysuria, flank pain, pelvic pain, vaginal bleeding, vaginal discharge and vaginal pain.  Neurological:  Negative for dizziness and headaches.  Psychiatric/Behavioral: Negative.      I have reviewed patient's Past Medical Hx, Surgical Hx, Family Hx, Social Hx, medications and allergies.   Physical Exam  Patient Vitals for the past 24 hrs:  BP Temp Temp src Pulse Resp SpO2  04/23/21 0940 -- -- -- -- -- 98 %  04/23/21 0935 -- -- -- -- -- 97 %  04/23/21 0930 116/73 -- -- 86 -- 98 %  04/23/21 0927 122/87 -- -- 82 -- --  04/23/21 0925 -- -- -- -- -- 98 %  04/23/21 0920 -- -- -- -- -- 97 %  04/23/21 0915 -- -- -- -- -- 97 %  04/23/21 0910 -- -- -- -- -- 96 %  04/23/21 0905 -- -- -- -- -- 97 %  04/23/21 0900 -- -- -- -- -- 97 %  04/23/21 0855 -- -- -- -- --  97 %  04/23/21 0850 -- -- -- -- -- 98 %  04/23/21 0845 -- -- -- -- -- 99 %  04/23/21 0832 135/83 97.6 F (36.4 C) Oral 91 17 99 %   Constitutional: Well-developed, well-nourished female in no acute distress.  Cardiovascular: normal rate Respiratory: normal effort GI: Abd soft, non-tender, gravid appropriate for gestational age.  MS: Extremities nontender, no edema, normal ROM Neurological - PERRLA, alert, oriented, normal speech, no focal findings or movement disorder noted, screening mental status exam normal, cranial nerves II through XII intact, DTR's normal and symmetric, motor and sensory grossly normal bilaterally, normal muscle tone, no tremors, strength 5/5  GU: Neg CVAT.  PELVIC EXAM: Deferred     FHT:  Baseline 135 , moderate variability, accelerations present, no decelerations Contractions: None on toco or to palpation   Labs: Results for orders placed or performed during the hospital encounter of 04/23/21 (from the past 24 hour(s))  Urinalysis, Routine w reflex microscopic Urine, Clean Catch     Status: Abnormal   Collection Time: 04/23/21  8:45 AM  Result Value Ref Range   Color, Urine YELLOW YELLOW   APPearance CLEAR CLEAR   Specific Gravity, Urine 1.010 1.005 - 1.030   pH 7.0 5.0 - 8.0   Glucose, UA NEGATIVE NEGATIVE mg/dL   Hgb urine dipstick NEGATIVE NEGATIVE   Bilirubin Urine NEGATIVE NEGATIVE   Ketones, ur NEGATIVE NEGATIVE mg/dL   Protein, ur NEGATIVE NEGATIVE mg/dL   Nitrite NEGATIVE NEGATIVE   Leukocytes,Ua MODERATE (A) NEGATIVE  Urinalysis, Microscopic (reflex)     Status: Abnormal   Collection Time: 04/23/21  8:45 AM  Result Value Ref Range   RBC / HPF 0-5 0 - 5 RBC/hpf   WBC, UA 6-10 0 - 5 WBC/hpf   Bacteria, UA FEW (A) NONE SEEN   Squamous Epithelial / LPF 0-5 0 - 5  Protein / creatinine ratio, urine     Status: None   Collection Time: 04/23/21  9:28 AM  Result Value Ref Range   Creatinine, Urine 59.25 mg/dL   Total Protein, Urine 7 mg/dL    Protein Creatinine Ratio 0.12 0.00 - 0.15 mg/mg[Cre]  CBC     Status: Abnormal   Collection Time: 04/23/21  9:48 AM  Result Value Ref Range  WBC 11.2 (H) 4.0 - 10.5 K/uL   RBC 3.81 (L) 3.87 - 5.11 MIL/uL   Hemoglobin 11.2 (L) 12.0 - 15.0 g/dL   HCT 33.9 (L) 36.0 - 46.0 %   MCV 89.0 80.0 - 100.0 fL   MCH 29.4 26.0 - 34.0 pg   MCHC 33.0 30.0 - 36.0 g/dL   RDW 12.9 11.5 - 15.5 %   Platelets 292 150 - 400 K/uL   nRBC 0.0 0.0 - 0.2 %  Comprehensive metabolic panel     Status: Abnormal   Collection Time: 04/23/21  9:48 AM  Result Value Ref Range   Sodium 133 (L) 135 - 145 mmol/L   Potassium 4.3 3.5 - 5.1 mmol/L   Chloride 104 98 - 111 mmol/L   CO2 20 (L) 22 - 32 mmol/L   Glucose, Bld 94 70 - 99 mg/dL   BUN 11 6 - 20 mg/dL   Creatinine, Ser 0.55 0.44 - 1.00 mg/dL   Calcium 8.9 8.9 - 10.3 mg/dL   Total Protein 6.0 (L) 6.5 - 8.1 g/dL   Albumin 2.7 (L) 3.5 - 5.0 g/dL   AST 20 15 - 41 U/L   ALT 15 0 - 44 U/L   Alkaline Phosphatase 90 38 - 126 U/L   Total Bilirubin 0.3 0.3 - 1.2 mg/dL   GFR, Estimated >60 >60 mL/min   Anion gap 9 5 - 15      Imaging:   MAU Course/MDM: Orders Placed This Encounter  Procedures   Urinalysis, Routine w reflex microscopic Urine, Clean Catch   Urinalysis, Microscopic (reflex)   CBC   Comprehensive metabolic panel   Protein / creatinine ratio, urine   ED EKG   Discharge patient    No orders of the defined types were placed in this encounter.    NST reviewed and reactive PEC labs wnl, with P/C ratio 0.12 PT without chest symptoms, or any PEC symptoms in MAU EKG with NSR BP grossly normal in MAU Neuro exam wnl in MAU  Consult Dr Elgie Congo with presentation, exam findings and test results.  Reassurance provided to patient, pt to continue kick counts, medications, close outpatient follow up as directed by [facility] Pt has OB appt on Wednesday, 04/26/21 but was instructed by on call RN on Friday to call tomorrow, 04/24/21 to make earlier appt.   Keep MFM appt on 04/25/21 Return to MAU as needed for emergencies    Assessment: 1. Gestational hypertension, third trimester   2. Poor fetal growth affecting management of mother in third trimester, single or unspecified fetus   3. [redacted] weeks gestation of pregnancy     Plan: Discharge home Labor precautions and fetal kick counts  Follow-up Information     [facility] Follow up today.   Why: Call office on Monday, 04/24/21, as directed by your Ob/Gyn provider. Follow up with MFM on Tuesday, 04/25/21, as directed. Contact information: [place] [address] [phone]         [facility] Follow up.   Specialty: Obstetrics and Gynecology Why: As needed for emergencies Contact information: [address] [id number] [place] [phone]               Allergies as of 04/23/2021       Reactions   Compazine [prochlorperazine] Other (See Comments)   psychosis   Other    Pistachios, soy, barely, peanuts, peas GI issues        Medication  List     TAKE these medications    acetaminophen 325 MG tablet Commonly known as: TYLENOL Take 650 mg by mouth every 6 (six) hours as needed for mild pain or headache.   albuterol 108 (90 Base) MCG/ACT inhaler Commonly known as: VENTOLIN HFA Inhale into the lungs every 6 (six) hours as needed for wheezing or shortness of breath.   aspirin EC 81 MG tablet Take 81 mg by mouth daily. Swallow whole.   cholecalciferol 25 MCG (1000 UNIT) tablet Commonly known as: VITAMIN D3 Take 1,000 Units by mouth daily.   labetalol 100 MG tablet Commonly known as: NORMODYNE Take 100 mg by mouth 3 (three) times daily. Medication increased to 300mg  a day as per patient   prenatal multivitamin Tabs tablet Take 1 tablet by mouth daily at 12 noon.   sertraline 50 MG tablet Commonly known as: ZOLOFT Take 0.5 tablets (25 mg total) by mouth daily.        Fatima Blank Certified Nurse-Midwife 04/23/2021 11:57 AM

## 2021-04-25 ENCOUNTER — Other Ambulatory Visit: Payer: Self-pay | Admitting: Obstetrics and Gynecology

## 2021-04-25 ENCOUNTER — Ambulatory Visit: Payer: Commercial Managed Care - HMO | Admitting: *Deleted

## 2021-04-25 ENCOUNTER — Ambulatory Visit (HOSPITAL_BASED_OUTPATIENT_CLINIC_OR_DEPARTMENT_OTHER): Payer: Commercial Managed Care - HMO

## 2021-04-25 ENCOUNTER — Other Ambulatory Visit: Payer: Self-pay

## 2021-04-25 ENCOUNTER — Encounter (HOSPITAL_COMMUNITY): Payer: Self-pay | Admitting: Obstetrics and Gynecology

## 2021-04-25 ENCOUNTER — Inpatient Hospital Stay (EMERGENCY_DEPARTMENT_HOSPITAL)
Admission: AD | Admit: 2021-04-25 | Discharge: 2021-04-25 | Disposition: A | Discharge status: Home / Self Care | Payer: Commercial Managed Care - HMO | Source: Home / Self Care | Attending: Obstetrics and Gynecology | Admitting: Obstetrics and Gynecology

## 2021-04-25 ENCOUNTER — Inpatient Hospital Stay (HOSPITAL_COMMUNITY)
Admission: AD | Admit: 2021-04-25 | Discharge: 2021-05-11 | DRG: 787 | Disposition: A | Discharge status: Home / Self Care | Payer: Commercial Managed Care - HMO | Source: Ambulatory Visit | Attending: Obstetrics & Gynecology | Admitting: Obstetrics & Gynecology

## 2021-04-25 VITALS — BP 124/86 | HR 93

## 2021-04-25 DIAGNOSIS — Z3A33 33 weeks gestation of pregnancy: Secondary | ICD-10-CM

## 2021-04-25 DIAGNOSIS — Z905 Acquired absence of kidney: Secondary | ICD-10-CM | POA: Insufficient documentation

## 2021-04-25 DIAGNOSIS — F410 Panic disorder [episodic paroxysmal anxiety] without agoraphobia: Secondary | ICD-10-CM | POA: Insufficient documentation

## 2021-04-25 DIAGNOSIS — O165 Unspecified maternal hypertension, complicating the puerperium: Secondary | ICD-10-CM | POA: Diagnosis present

## 2021-04-25 DIAGNOSIS — O1414 Severe pre-eclampsia complicating childbirth: Secondary | ICD-10-CM | POA: Diagnosis present

## 2021-04-25 DIAGNOSIS — Z3A34 34 weeks gestation of pregnancy: Secondary | ICD-10-CM | POA: Diagnosis not present

## 2021-04-25 DIAGNOSIS — Z3689 Encounter for other specified antenatal screening: Secondary | ICD-10-CM | POA: Insufficient documentation

## 2021-04-25 DIAGNOSIS — O9081 Anemia of the puerperium: Secondary | ICD-10-CM | POA: Diagnosis not present

## 2021-04-25 DIAGNOSIS — O133 Gestational [pregnancy-induced] hypertension without significant proteinuria, third trimester: Secondary | ICD-10-CM | POA: Insufficient documentation

## 2021-04-25 DIAGNOSIS — O26893 Other specified pregnancy related conditions, third trimester: Secondary | ICD-10-CM | POA: Insufficient documentation

## 2021-04-25 DIAGNOSIS — O365930 Maternal care for other known or suspected poor fetal growth, third trimester, not applicable or unspecified: Secondary | ICD-10-CM

## 2021-04-25 DIAGNOSIS — Z79899 Other long term (current) drug therapy: Secondary | ICD-10-CM | POA: Insufficient documentation

## 2021-04-25 DIAGNOSIS — Z85528 Personal history of other malignant neoplasm of kidney: Secondary | ICD-10-CM | POA: Insufficient documentation

## 2021-04-25 DIAGNOSIS — O134 Gestational [pregnancy-induced] hypertension without significant proteinuria, complicating childbirth: Secondary | ICD-10-CM | POA: Diagnosis present

## 2021-04-25 DIAGNOSIS — Z20822 Contact with and (suspected) exposure to covid-19: Secondary | ICD-10-CM | POA: Diagnosis present

## 2021-04-25 DIAGNOSIS — O99892 Other specified diseases and conditions complicating childbirth: Secondary | ICD-10-CM | POA: Diagnosis present

## 2021-04-25 DIAGNOSIS — O09523 Supervision of elderly multigravida, third trimester: Secondary | ICD-10-CM | POA: Insufficient documentation

## 2021-04-25 DIAGNOSIS — Z3A35 35 weeks gestation of pregnancy: Secondary | ICD-10-CM | POA: Diagnosis not present

## 2021-04-25 DIAGNOSIS — O368190 Decreased fetal movements, unspecified trimester, not applicable or unspecified: Secondary | ICD-10-CM | POA: Diagnosis not present

## 2021-04-25 DIAGNOSIS — Z23 Encounter for immunization: Secondary | ICD-10-CM

## 2021-04-25 DIAGNOSIS — D62 Acute posthemorrhagic anemia: Secondary | ICD-10-CM | POA: Diagnosis not present

## 2021-04-25 DIAGNOSIS — O368390 Maternal care for abnormalities of the fetal heart rate or rhythm, unspecified trimester, not applicable or unspecified: Secondary | ICD-10-CM | POA: Diagnosis not present

## 2021-04-25 DIAGNOSIS — O368130 Decreased fetal movements, third trimester, not applicable or unspecified: Secondary | ICD-10-CM | POA: Insufficient documentation

## 2021-04-25 DIAGNOSIS — O99344 Other mental disorders complicating childbirth: Secondary | ICD-10-CM | POA: Diagnosis present

## 2021-04-25 DIAGNOSIS — F419 Anxiety disorder, unspecified: Secondary | ICD-10-CM | POA: Diagnosis present

## 2021-04-25 DIAGNOSIS — K219 Gastro-esophageal reflux disease without esophagitis: Secondary | ICD-10-CM | POA: Diagnosis present

## 2021-04-25 DIAGNOSIS — O43193 Other malformation of placenta, third trimester: Secondary | ICD-10-CM | POA: Insufficient documentation

## 2021-04-25 DIAGNOSIS — R519 Headache, unspecified: Secondary | ICD-10-CM | POA: Diagnosis not present

## 2021-04-25 DIAGNOSIS — O24435 Gestational diabetes mellitus in puerperium, controlled by oral hypoglycemic drugs: Secondary | ICD-10-CM

## 2021-04-25 DIAGNOSIS — N189 Chronic kidney disease, unspecified: Secondary | ICD-10-CM | POA: Diagnosis not present

## 2021-04-25 DIAGNOSIS — O365990 Maternal care for other known or suspected poor fetal growth, unspecified trimester, not applicable or unspecified: Secondary | ICD-10-CM | POA: Diagnosis not present

## 2021-04-25 DIAGNOSIS — I129 Hypertensive chronic kidney disease with stage 1 through stage 4 chronic kidney disease, or unspecified chronic kidney disease: Secondary | ICD-10-CM | POA: Diagnosis not present

## 2021-04-25 DIAGNOSIS — O9962 Diseases of the digestive system complicating childbirth: Secondary | ICD-10-CM | POA: Diagnosis present

## 2021-04-25 DIAGNOSIS — O365931 Maternal care for other known or suspected poor fetal growth, third trimester, fetus 1: Secondary | ICD-10-CM

## 2021-04-25 HISTORY — DX: Depression, unspecified: F32.A

## 2021-04-25 LAB — RESP PANEL BY RT-PCR (FLU A&B, COVID) ARPGX2
Influenza A by PCR: NEGATIVE
Influenza B by PCR: NEGATIVE
SARS Coronavirus 2 by RT PCR: NEGATIVE

## 2021-04-25 LAB — TYPE AND SCREEN
ABO/RH(D): A POS
Antibody Screen: NEGATIVE

## 2021-04-25 MED ORDER — PRENATAL MULTIVITAMIN CH
1.0000 | ORAL_TABLET | Freq: Every day | ORAL | Status: DC
  Administered 2021-04-26 – 2021-05-08 (×13): 1 via ORAL
  Filled 2021-04-25 (×4): qty 1

## 2021-04-25 MED ORDER — ASPIRIN EC 81 MG PO TBEC
81.0000 mg | DELAYED_RELEASE_TABLET | Freq: Every day | ORAL | Status: DC
  Administered 2021-04-26 – 2021-05-08 (×13): 81 mg via ORAL
  Filled 2021-04-25 (×13): qty 1

## 2021-04-25 MED ORDER — CALCIUM CARBONATE ANTACID 500 MG PO CHEW
2.0000 | CHEWABLE_TABLET | ORAL | Status: DC | PRN
  Filled 2021-04-25: qty 2

## 2021-04-25 MED ORDER — ALBUTEROL SULFATE (2.5 MG/3ML) 0.083% IN NEBU: 3.0000 mL | INHALATION_SOLUTION | Freq: Four times a day (QID) | RESPIRATORY_TRACT | Status: DC | PRN

## 2021-04-25 MED ORDER — LABETALOL HCL 100 MG PO TABS
100.0000 mg | ORAL_TABLET | Freq: Three times a day (TID) | ORAL | Status: DC
  Administered 2021-04-25 – 2021-05-08 (×40): 100 mg via ORAL
  Filled 2021-04-25 (×41): qty 1

## 2021-04-25 MED ORDER — DOCUSATE SODIUM 100 MG PO CAPS
100.0000 mg | ORAL_CAPSULE | Freq: Every day | ORAL | Status: DC
  Administered 2021-04-27 – 2021-05-08 (×3): 100 mg via ORAL
  Filled 2021-04-25 (×9): qty 1

## 2021-04-25 MED ORDER — ZOLPIDEM TARTRATE 5 MG PO TABS
5.0000 mg | ORAL_TABLET | Freq: Every evening | ORAL | Status: DC | PRN
  Filled 2021-04-25: qty 1

## 2021-04-25 MED ORDER — ACETAMINOPHEN 325 MG PO TABS
650.0000 mg | ORAL_TABLET | ORAL | Status: DC | PRN
  Filled 2021-04-25: qty 2

## 2021-04-25 NOTE — Progress Notes (Signed)
Dr. Donalee Citrin ordered a BPP and doppler u/s since NST was non reactive.

## 2021-04-25 NOTE — H&P (Signed)
Melanie Cooley is a 36 y.o. female G3P0020 [redacted]w[redacted]d presenting for admission for decreased fetal movement in the setting of severe fetal growth restriction, gHTN, and patient anxiety.   Patient developed early onset gHTN at 63 weeks and was started on PO Labetalol regimen at that time. She has no prior history of HTN outside of pregnancy and had remained normotensive throughout the first and second trimesters. She did receive a course of BMZ at time of diagnosis 12/8-12/9 and PIH labs WNL. Since that time, labetalol has been titrated up and down due to labile pressures, and currently on a regimen of 100 mg q 8hr. She has had multiple PIH lab sets drawn, most recently done this week 1/8 at [facility]U.   Due to new onset gHTN, patient had a growth US performed on 12/27 which showed severe IUGR with an EFW 1% (2#14) and elevated S/D ratio but no evidence of absent or reverse end diastolic flow for UAD. She was then referred to MFM for consultation. On 12/28 MFM growth [place] also showed severe IUGR with EFW 1282 grams (<1%) and unchanged UAD. Since then she has been followed with twice weekly antenatal testing and once weekly UAD by MFM which has remained reassuring. However, at scheduled NST visit with MFM this morning, patient expressed concern for decreased FM. The NST was reassuring but non-reactive so BPP was performed which was 8/10 (2 off for NST). In consultation with MFM Dr. Donalee Citrin, advised admission given high risk pregnancy with severe IUGR and gHTN.   Patient received initial [place] at [facility] where pregnancy was dated by LMP c/w 5w [place]. She did have [facility] noted in first trimester with VB that resolved in the second trimester. Routine prenatal labs WNL. Low risk Materni21 NIPS and normal anatomy US with MCI noted. She has been on daily baby ASA for AMA PEC risk factors. She then transferred to [facility] at 20 weeks and has received routine PNC there since that time. She had a normal 1hr GTT of 91 and mild IDA  with Hgb 11.0 at 28w. She was last seen in office yesterday 1/9 where she had a routine visit with reactive NST.  Pregnancy also complicated by significant anxiety/depression. She has had multiple office and MAU visits for concerns regarding FM and Bps. Patient has long standing history, managed by psychiatry in the past. She has been on a number of SSRI in the past, none of which helped her anxiety. Had then been on Lamictal for 10 years but discontinued 3 years prior. Initially no medications at the start of pregnancy, but has tried starting Zoloft 25 mg daily more recently off and on. Currently not taking and would prefer to hold until PP period.   OB history significant for previous SAB x1 and TAB x1.   Surgical history significant for laparoscopic right partial nephrectomy for stage 1 renal cancer in 2017 and was signed off by Nephrology after 5 years surveillance without recurrence.   Today feels much better and at ease being in the hospital. Good FM with application of EFM. No complaints of contractions, VB, or LOF. No c/o HA, vision changes, CP, SOB. Patient and fiance Heron Sabins are expecting a baby boy.   OB History     Gravida  3   Para      Term      Preterm      AB  2   Living  0      SAB  1   IAB  1   Ectopic      Multiple      Live Births             Past Medical History:  Diagnosis Date   Anxiety    Cancer ([place])    Chronic kidney disease    Depression    Past Surgical History:  Procedure Laterality Date   INDUCED ABORTION     NEPHRECTOMY Right    Partial due to cancer   Family History: family history includes Cancer in her maternal grandfather and maternal grandmother; Hyperlipidemia in her father; Mesothelioma in her paternal grandmother. Social History:  reports that she has never smoked. She has never used smokeless tobacco. She reports that she does not drink alcohol and does not use drugs.     Maternal Diabetes: No Genetic Screening:  Normal Maternal Ultrasounds/Referrals: IUGR Fetal Ultrasounds or other Referrals:  Referred to Materal Fetal Medicine  Maternal Substance Abuse:  No Significant Maternal Medications:  Meds include: Other:  Significant Maternal Lab Results:  None Other Comments:  None  Review of Systems  All other systems reviewed and are negative. Per HPI   Fetal Exam Fetal Monitor Review: Baseline rate: 130.  Variability: moderate (6-25 bpm).   Pattern: accelerations present.   Fetal State Assessment: Category I - tracings are normal.  Physical Exam Vitals reviewed.  Constitutional:      Appearance: Normal appearance.  HENT:     Head: Normocephalic.  Cardiovascular:     Rate and Rhythm: Normal rate.  Pulmonary:     Effort: Pulmonary effort is normal.  Abdominal:     Tenderness: There is no abdominal tenderness.  Musculoskeletal:        General: Normal range of motion.  Skin:    General: Skin is warm and dry.  Neurological:     General: No focal deficit present.     Mental Status: She is alert and oriented to person, place, and time.  Psychiatric:        Mood and Affect: Mood normal.        Behavior: Behavior normal.      Blood pressure 130/80, pulse 82, temperature 98.9 F (37.2 C), temperature source Oral, resp. rate 18, height 5\' 6"  (1.676 m), weight 89.3 kg, last menstrual period 09/02/2020, SpO2 99 %, unknown if currently breastfeeding.  Prenatal labs: ABO, Rh:  --/--/A POS (01/10 1703) Antibody: NEG (01/10 1703) Rubella:  IMMUNE RPR:   NON-REACTIVE HBsAg:   NEGATIVE HIV:   NON-REACTIVE GBS:     Chemistry Recent Labs  Lab 04/21/21 0211 04/23/21 0948  NA 132* 133*  K 3.9 4.3  CL 104 104  CO2 20* 20*  GLUCOSE 91 94  BUN 11 11  CREATININE 0.59 0.55  CALCIUM 9.3 8.9  GFRNONAA >60 >60  ANIONGAP 8 9    Recent Labs  Lab 04/21/21 0211 04/23/21 0948  PROT 6.1* 6.0*  ALBUMIN 2.8* 2.7*  AST 23 20  ALT 12 15  ALKPHOS 96 90  BILITOT <0.1* 0.3    Hematology Recent Labs  Lab 04/21/21 0211 04/23/21 0948  WBC 12.8* 11.2*  RBC 3.89 3.81*  HGB 11.6* 11.2*  HCT 34.3* 33.9*  MCV 88.2 89.0  MCH 29.8 29.4  MCHC 33.8 33.0  RDW 12.6 12.9  PLT 309 292   Cardiac EnzymesNo results for input(s): TROPONINI in the last 168 hours. No results for input(s): TROPIPOC in the last 168 hours.  BNPNo results for input(s): BNP, PROBNP in the last 168 hours.  DDimer No results for input(s): DDIMER in the last 168 hours.   Assessment/Plan: Melanie Cooley is a 36 y.o. female G3P0020 [redacted]w[redacted]d admitted with decreased FM in setting of severe IUGR, gHTN, and anxiety  Per consultation with MFM, Dr. Donalee Citrin, recommend admission to antepartum service for observation and close fetal surveillance. Patient in agreement with plan of care as follows:  Severe IUGR -Continue fetal surveillance with MFM with UAD twice weekly- next scheduled for Friday 1/13- last growth [place] on 12/28- will defer to MFM for next growth scan -NST q shift -S/p BMZ 12/8-12/9 per MFM no indication for rescue course as delivery does not appear to be imminent before 34 weeks -Patient requesting NICU consultation while inpatient, will call in AM gHTN -S/p PIH labs 1/8 WNL, consider repeat weekly or sooner as indicated -Cont home regimen of PO Labetalol 100mg  q8hr and titrate as indicated -Cont daily baby ASA 81mg  PEC ppx Anxiety -Plan to restart Zoloft PP Routine Antepartum Care -Regular diet -Bowel regimen PRN -May ambulate, SCD while in bed VTE ppx -Daily PNV   Kaylum Shrum A Naiomi Musto 04/25/2021, 7:11 PM

## 2021-04-25 NOTE — MAU Provider Note (Signed)
Event Date/Time   First Provider Initiated Contact with Patient 04/25/21 0532     S Ms. Melanie Cooley is a 36 y.o. G62P0020 pregnant female at [redacted]w[redacted]d who presents to MAU today with complaint of anxiety and decreased fetal movement. Has gestational hypertension and severe IUGR and has been having nightly bouts of anxiety with panic attacks over her baby's possible decreased fetal movement. Same happened last night and when she woke up she was terrified he'd stopped moving again so came in. Feeling copious movement since arriving to MAU and requested to speak to a provider before being roomed for a NST. No other physical complaints.  Pertinent items noted in HPI and remainder of comprehensive ROS otherwise negative.   Receives care at [facility] and MFM. Has a MFM appt this morning at 9am. Has a history of anxiety, has tried several meds, only Lamictal has worked but her psychiatrist said that is not preferred in pregnancy and wants to put her on zoloft which gave her bad side effects.  O BP 132/87    Pulse 98    Temp 98.1 F (36.7 C)    Resp 18    LMP 09/02/2020  Physical Exam Vitals and nursing note reviewed.  Constitutional:      General: She is not in acute distress.    Appearance: Normal appearance. She is not ill-appearing.  HENT:     Head: Normocephalic and atraumatic.  Eyes:     Pupils: Pupils are equal, round, and reactive to light.  Cardiovascular:     Rate and Rhythm: Normal rate and regular rhythm.     Pulses: Normal pulses.  Pulmonary:     Effort: Pulmonary effort is normal.  Abdominal:     Palpations: Abdomen is soft.     Tenderness: There is no abdominal tenderness.  Musculoskeletal:        General: Normal range of motion.  Skin:    General: Skin is warm and dry.     Capillary Refill: Capillary refill takes less than 2 seconds.  Neurological:     Mental Status: She is alert and oriented to person, place, and time.  Psychiatric:        Mood and Affect: Mood  normal.        Behavior: Behavior normal.        Thought Content: Thought content normal.        Judgment: Judgment normal.   FHT: 135 with accelerations into the 140s and 150s while listening with doppler.  Copious reassurance given to patient that it is ok to come in if she discerns DFM. Explained that smaller babies can position themselves so that fetal movement is difficult to feel. Pt says her belly gets tight at night, explained that it is also difficult to feel vigorous fetal movement during BH contractions due to the tightness of the muscle. Suggested she begin a nightly routine of quiet meditation before she goes to bed to help her prevent the anxiety and have a moment to feel the baby kicking. Explained how deep breathing can stimulate fetal movement. Pt expressed feeling much better and declined further treatment since she is now feeling copious movement and has her appt at 9am.  A Fetal movement palpable, third trimester Fetal heart tones present, third trimester [redacted] weeks gestation Medical screening exam complete  P Discharge from MAU in stable condition with return precautions Follow up at [place] and MFM as scheduled for ongoing prenatal care.  Gabriel Carina, [place] 04/25/2021 5:32  AM  ° °

## 2021-04-25 NOTE — Addendum Note (Signed)
Addended by: Lanier Prude A on: 04/25/2021 10:47 AM   Modules accepted: Orders

## 2021-04-25 NOTE — MAU Note (Signed)
Pt stated she has did not feel baby much yesterday. Had NST in office and it was normal. Pt still was not feeling baby move much tonight and is worried. Did feel baby move 5 times since she was waiting and is unsure now if she needs to have an NST. Wants to talk to midwife first.

## 2021-04-25 NOTE — MAU Note (Signed)
Melanie Cooley,CNM talked with pt and  did MSE. Discharged pt from Triage

## 2021-04-25 NOTE — Procedures (Signed)
Georgena Weisheit 13-Mar-1986 [redacted]w[redacted]d  Fetus A Non-Stress Test Interpretation for 04/25/21  Indication: IUGR  Fetal Heart Rate A Mode: External Baseline Rate (A): 135 bpm Variability: Moderate Accelerations: 10 x 10 Decelerations: Variable  Uterine Activity Mode: Toco Contraction Frequency (min): none Resting Tone Palpated: Relaxed  Interpretation (Fetal Testing) Nonstress Test Interpretation: Non-reactive Overall Impression: Non-reassuring Comments: tracing reviewed by Dr. Hilliard Clark

## 2021-04-26 MED ORDER — FAMOTIDINE 20 MG PO TABS
20.0000 mg | ORAL_TABLET | Freq: Two times a day (BID) | ORAL | Status: DC
  Administered 2021-04-26 – 2021-05-08 (×21): 20 mg via ORAL
  Filled 2021-04-26 (×26): qty 1

## 2021-04-26 NOTE — Progress Notes (Signed)
Called by nursing about spontaneous deceleration to 50s, back to baseline with accels noted after, lasted 2-66min - recommendation for continuous monitoring and I was going to review FHT; nurse called back stating that patient wanted to talk to me, I was unable to talk as I was dealing with an emergency, that I would speak with pt when I was free  In room with pt and fiance Pt lying on left side; very anxious about deceration, says was wondering if that meant emergency c/s  No other c/o at this time except a pain in her right groin occasional, seems more with changing position in bed; no ctx/vb/lof or other c/o  Patient Vitals for the past 24 hrs:  BP Temp Temp src Pulse Resp SpO2  04/26/21 2237 (!) 153/100 98.5 F (36.9 C) Oral 83 20 97 %  04/26/21 1926 130/72 98.2 F (36.8 C) Oral 81 18 100 %  04/26/21 1512 134/79 98.3 F (36.8 C) Oral 88 18 100 %  04/26/21 1225 (!) 146/80 98.3 F (36.8 C) Oral 81 18 99 %  04/26/21 0805 125/88 98.1 F (36.7 C) Oral 89 18 100 %  04/26/21 0539 115/65 -- -- 79 -- --  04/26/21 0019 120/78 98 F (36.7 C) Oral 71 18 100 %  Last bp done just after deceleration when her labetalol due, pt was very anxious at this time  A&ox3, lying in bed Nml respirations Abd; soft, nt,nd LE: no edema, nt bilat, scds in place  FHT: 120s, nml variability at start of NST, then 3 min decel with nadir at 50s then to 150s then back to baseline,  normal variability returned after few min of decreased variability; baseline back at 120s with accels; no further decel TOCO: no contractions  A/P: iup at 33.5 wga Severe IUGR - isolated deceleration, plan continuous monitoring; until am and will reassess; reviewed extensively with pt and fiance deceleration, monitoring, what is concerning for fetal distress vs needing increased monitoring, pt will be informed with any management change, nursing staff good resources for questions, on call physician expectations (verbalized to nursing that  upset I was unavailable during and emergency and unable to speak with her, though doctor was going to run in after decel and inform of emergency c/s; asking what kind of emergency I was involved in, asking if there was another on call doctor to speak with) - understands that we are not in hospital at all times, if unavailable when pt requesting to speak with physician, will certainly speak as soon as avail;  nursing good resource for questions; pt understands plan for monitoring and questions answered Anxiety - xanax works the best, stopped all management with pregnancy; was on lamictal x10 years and stopped with pregnancy; was on buspar long time ago, ?palpitations when she would go to sleep though uncertain related to medication; zoloft really doesn't help; discussed importance of managing anxiety with her long h/o anxiety and now in high anxiety situation; recommend daily management, try buspar, also recommend Lorrin Mais now and q hs; also discussed importance of distracting mind/keeping occupied while in-patient as this can worsen anxiety, partner and pt agree; pt agrees to [place] now and plan begin buspar in am; pt and partner both agreeable to plan; pt and partner felt much less anxious after discussion, optimistic about being on med to help; after I left room, pt then declined ambien when nurse went to give it to her - afraid to take a new medication, has had SE from multiple meds in  past; reviewed med, pt states normally takes unisom to sleep or benadryl - wants this instead, ok for this; plan begin buspar in am, pt still agrees with this plan; pt much more calm at discussion end, speaking at normal level (initially speaking with raised voice) Fetal status - overall reassuring, continuous monitoring Round ligament pain reviwed Elevated bp - anxious at time of bp check, also due for med, follow closely, contin current regimen Depression - may need additional management but address severe anxiety now

## 2021-04-26 NOTE — Consult Note (Signed)
[facility]  Prenatal Consult       04/26/2021  8:47 PM   I was asked by Dr. Lanny Cramp to consult on this patient for possible preterm delivery. I had the pleasure of meeting with Melanie Cooley and Melanie partner today. She is a G3P0 woman who is currently at [redacted]w[redacted]d gestation. Pregnancy has been complicated by gHTN and fetal growth restriction, with most recent scan at the end of December indicating EFW ~1% (~1280g). They are expecting a baby girl, to be named Melanie Cooley. They have questions about prematurity, what dictates a need for NICU admission, what that care will look like, and longterm outcomes at this gestational age.  I explained that the neonatal intensive care team would be present for the delivery and outlined the likely delivery room course for this baby including routine resuscitation and NRP-guided approaches to the treatment of respiratory distress. We discussed other common problems associated with prematurity including respiratory distress, apnea, feeding issues, temperature regulation. We briefly discussed outcomes related to baby's GA and growth restriction (high survival rate ~90%, increased risk for mild cognitive issues, learning disabilities, ADHD, ASD, low risk of severe impairment/CP given current information about the pregnancy/baby).   We discussed the average length of stay but I noted that the actual LOS would depend on the severity of problems encountered and response to treatments. We discussed visitation policies and the resources available while Melanie Cooley is in the hospital.  We discussed the importance of good nutrition and various methods of providing nutrition (parenteral hyperalimentation, gavage feedings and/or oral feeding). We discussed the benefits of human milk. I encouraged breast feeding and pumping soon after birth and outlined resources that are available to support breast feeding. We discussed the possibility of using donor breast milk as a  bridge.  Thank you for involving [place] in the care of this patient. A member of our team is available should the family have additional questions.  Time for consultation approximately 30 minutes of face-to-face counseling re: prematurity and growth restriction.  Renato Shin, MD Neonatal Medicine

## 2021-04-26 NOTE — Progress Notes (Signed)
Melanie Cooley 36 y.o. G3P0020 at [redacted]w[redacted]d HD#2 admitted with severe IUGR <1% gHTN and dec FM  S: Pt sleeping upon entering room. She is a/b her fiance Melanie Cooley at bedside. Throughout the visit, patient's mom and sister joined at bedside. Pt reports better sleep less anxious being in the hospital. FM are present but not as strong/forceful as they normally are in the morning, but still present and increase in intensity when fetal monitors applied. Some usual pelvic pressure depending on position but denies any CTXs, VB, or LOF. No CP, SOB, epigastric pain, N/V. Some acid reflux and notices wheezes at night sometimes. Currently taking Tums only for acid reflux. Patient and family have been questions regarding inpatient course and fetal outcomes.   O: Vitals:   04/26/21 0019 04/26/21 0539 04/26/21 0805 04/26/21 1225  BP: 120/78 115/65 125/88 (!) 146/80  Pulse: 71 79 89 81  Resp: 18  18 18   Temp: 98 F (36.7 C)  98.1 F (36.7 C) 98.3 F (36.8 C)  TempSrc: Oral  Oral Oral  SpO2: 100%  100% 99%  Weight:      Height:       Physical Exam: -General: AAO, NAD -Heart: RRR -Lungs: CTABL no audible wheezes, rales, or crackles -Abdomen: gravid uterus, non-tender -Extremities: no LE edema, compression stockings on  Fetal Monitoring: -NST: reactive this morning baseline 135 bpm mod var +accels 15x15's -decels -Toco: acontractile  12/28 EFW <1% 5/49 BPP 8/8 cephalic, UAD elevated S/D ratio but no evidence of absent or reverse flow  A/P: Melanie Cooley 36 y.o. G3P0020 at [redacted]w[redacted]d HD#2 admitted with severe IUGR <1%, gHTN, dec FM, and anxiety, now with reassuring fetal status and patient reassurance  Patient and family questions answered regarding plan of care moving forward. Discussed possible inpatient admission until delivery pending fetal monitoring. Patient desires c/s as MOD. Risks/benefits previously reviewed. Timing of delivery pending MFM recommendations based on fetal monitoring. Patient reassured of  reactive NST this morning indicating fetal well being today and will need to continue to monitor. Questions answered to patient satisfaction.  Severe IUGR -Continue fetal surveillance with MFM with UAD twice weekly- next scheduled for Friday 1/13- last growth [place] on 12/28- will defer to MFM for next growth scan -NST q shift- reactive last night and this morning -S/p BMZ 12/8-12/9 per MFM no indication for rescue course as delivery does not appear to be imminent before 34 weeks -Called NICU for consult and order placed, pt aware gHTN -S/p PIH labs 1/8 WNL, consider repeat weekly or sooner as indicated -Cont home regimen of PO Labetalol 100mg  q8hr and titrate as indicated -Cont daily baby ASA 81mg  PEC ppx Anxiety -Plan to restart Zoloft PP Routine Antepartum Care -Regular diet -Bowel regimen PRN. Will add Pepcid  BID for acid reflux  -May ambulate, SCD while in bed VTE ppx -Daily PNV   Melanie Cooley 04/26/21 12:40 PM

## 2021-04-27 DIAGNOSIS — O368390 Maternal care for abnormalities of the fetal heart rate or rhythm, unspecified trimester, not applicable or unspecified: Secondary | ICD-10-CM

## 2021-04-27 DIAGNOSIS — O133 Gestational [pregnancy-induced] hypertension without significant proteinuria, third trimester: Secondary | ICD-10-CM

## 2021-04-27 DIAGNOSIS — O365990 Maternal care for other known or suspected poor fetal growth, unspecified trimester, not applicable or unspecified: Secondary | ICD-10-CM

## 2021-04-27 MED ORDER — BUSPIRONE HCL 15 MG PO TABS
7.5000 mg | ORAL_TABLET | Freq: Two times a day (BID) | ORAL | Status: DC
  Filled 2021-04-27 (×7): qty 1

## 2021-04-27 MED ORDER — DOXYLAMINE SUCCINATE (SLEEP) 25 MG PO TABS
25.0000 mg | ORAL_TABLET | Freq: Every day | ORAL | Status: DC
  Administered 2021-04-27 – 2021-04-28 (×2): 25 mg via ORAL
  Filled 2021-04-27 (×8): qty 1

## 2021-04-27 NOTE — Progress Notes (Signed)
Chart reviewed, FHT reviewed, spoke w/ patient, POC d/w pt.  Sono, UAD tomorrow

## 2021-04-27 NOTE — Progress Notes (Signed)
At 2219 on 04-26-21 fetal monitor had a 3 minute decel down in 50's.  Derrell Lolling, CNM notified at 2228 then she said to call Dr Murrell Redden.  Dr Murrell Redden notified at 2231.  Order received for continuous fetal monitoring during night.  BP taken after notifying patient of continuous monitoring and why. B/P was 153/100 patient became restless and very anxious. Patient requested to talk with MD at that time which Dr Murrell Redden was notified again.  Dr Murrell Redden in at  2307 to talk with patient and significant other.

## 2021-04-27 NOTE — Progress Notes (Signed)
Dr. Benjie Karvonen in with RN to discuss with patient POC and to re-evaluate complaints of fluid. No fluid noted on peripad. MD instructed patient to let her nurse know if she continues to see LOF. Patient verbalizes understanding. MD at bedside discussing POC with patient and her family.

## 2021-04-27 NOTE — Progress Notes (Signed)
° °  No c/o; denies ctx,lof, vb; +fm Questions about FHT, decels, when is it concerning; says doesn't remember much of conversation last night - was sleepy and anxious  Patient Vitals for the past 24 hrs:  BP Temp Temp src Pulse Resp SpO2  04/27/21 0744 129/72 97.9 F (36.6 C) Oral 72 18 100 %  04/27/21 0554 123/71 98.1 F (36.7 C) Oral 74 18 100 %  04/27/21 0023 123/63 -- -- 82 20 98 %  04/26/21 2237 (!) 153/100 98.5 F (36.9 C) Oral 83 20 97 %  04/26/21 1926 130/72 98.2 F (36.8 C) Oral 81 18 100 %  04/26/21 1512 134/79 98.3 F (36.8 C) Oral 88 18 100 %  04/26/21 1225 (!) 146/80 98.3 F (36.8 C) Oral 81 18 99 %   A&ox3 Nml respirations Abd: soft,nt,nd, gravid LE: no edema, nt bilat  FHT: baseline mostly 130s, nml variability; +accels, occasional variable; 1 decel about 45 seconds with change in baseline about 10bpm then back to baseline, nml variability continued, accels after TOCO: no ctx  Bpp: 1/61: cephalic, fundal placenta with succenturiate lobe, MCI, afi 9.7cm; elevated S/D ratio, no aedf, bpp 8/10  1.Severe IUGR -Continue fetal surveillance with MFM with UAD twice weekly- next scheduled for Friday 1/13- last growth [place] on 12/28 (EFW 1%) - repeat efw per MFM - reassuring FHT for last few hrs, will stop continuous monitoring for 2 hrs, then plan 2 hr monitoring, if reassuring can then return to q shift -S/p BMZ 12/8-12/9 per MFM no indication for rescue course as delivery does not appear to be imminent before 34 weeks -s/p NICU consult gHTN -S/p PIH labs 1/8 WNL, plan repeat weekly or sooner as indicated -Cont PO Labetalol 100mg  q8hr  -Cont daily baby ASA 81mg  PEC ppx Anxiety -start buspar today Routine Antepartum Care -Regular diet -Bowel regimen PRN -May ambulate, SCD while in bed VTE ppx -Daily PNV  Delivery planning: Dr Gertie Exon (MFM) called this am to discuss recommendations for delivery.  Plan goal of 57 wga, but if patient is having decelerations, can move to  delivery since such severe IUGR.

## 2021-04-27 NOTE — Progress Notes (Signed)
B/p re-checked at 0023 BP 123/63, pulse 82. Unisom 25 mg po given at 0020 as ordered.  Patient agreed to take Unisom, refused Ambien.

## 2021-04-27 NOTE — Progress Notes (Signed)
Patient calls out and Odis Hollingshead RN reporting that patient complains of fluid on her panties. Mildly damp panties noted per RN report and RN instructed patient to put peri pad and would re-evaluate in one hour.

## 2021-04-28 ENCOUNTER — Inpatient Hospital Stay (HOSPITAL_BASED_OUTPATIENT_CLINIC_OR_DEPARTMENT_OTHER): Payer: Commercial Managed Care - HMO

## 2021-04-28 ENCOUNTER — Ambulatory Visit: Payer: Managed Care, Other (non HMO)

## 2021-04-28 DIAGNOSIS — Z3A34 34 weeks gestation of pregnancy: Secondary | ICD-10-CM | POA: Diagnosis not present

## 2021-04-28 DIAGNOSIS — O365930 Maternal care for other known or suspected poor fetal growth, third trimester, not applicable or unspecified: Secondary | ICD-10-CM

## 2021-04-28 NOTE — Progress Notes (Signed)
34 wks GHTN Severe IUGR  Elevated UAD   No c/o; denies ctx,lof, vb; +fm No HA/ vision changes. Still not having active FMs   BP (!) 141/80    Pulse 86    Temp 98.1 F (36.7 C) (Oral)    Resp 18    Ht 5\' 6"  (1.676 m)    Wt 89.3 kg    LMP 09/02/2020    SpO2 100%    BMI 31.76 kg/m    A&ox3 Nml respirations Abd: soft,nt,nd, gravid LE: no edema, nt bilat  FHT: baseline mostly 130s, nml variability; +accels no decels  TOCO: no ctx  Bpp: 9/62: cephalic, fundal placenta with succenturiate lobe, MCI, afi 9.7cm; elevated S/D ratio, no aedf, bpp 8/10 Repeat UAD today. Report pending   1.Severe IUGR -Continue fetal surveillance with MFM with UAD twice weekly- UAD today. Last growth [place] on 12/28 (EFW 1%) - repeat efw per MFM -S/p BMZ 12/8-12/9 per MFM no indication for rescue course as delivery does not appear to be imminent before 34 weeks -s/p NICU consult gHTN -S/p PIH labs 1/8 WNL, plan repeat weekly or sooner as indicated -Cont PO Labetalol 100mg  q8hr  -Cont daily baby ASA 81mg  PEC ppx Anxiety -start buspar today Routine Antepartum Care -Regular diet -Bowel regimen PRN -May ambulate, SCD while in bed VTE ppx -Daily PNV  Delivery planning: goal of 61 wga, but if patient is having decelerations, can move to delivery since severe IUGR.

## 2021-04-28 NOTE — Consult Note (Addendum)
MFM Brief Note  Melanie Cooley is a 36 yo G3P0 at 33w 6d who is seen in the inpatient due to decreased fetal movement with [facility] requiring medical therapy, severe fetal growth restriction (EFW 1%) and elevated UA Dopplers. Melanie Cooley has significant anxiety as well regarding the wellbeing of the the baby.  I spoke with Dr. Murrell Redden via telephone regarding timing of delivery given the context of Melanie Cooley's pregnancy.  In addition on 1/11 Melanie Cooley had a deceleration dow to the 50's and lasted 2-3 minutes.  The NST returned to based line and otherwise was reactive.   Her blood pressure has ranged in the 140's-150's/ 80's to 100's. She has normal CBC, CMP and UPC  I discussed with Dr. Murrell Redden and recommended remaining in patient with delivery planned at 15 weeks.  However, if her BP becomes severe, or prolonged or recurrent decelerations we recommend delivery.  I spent 25 minutes with > 50% in review of the patients records and in direct communication with Dr. Murrell Redden.  Vikki Ports, MD

## 2021-04-29 LAB — PROTEIN / CREATININE RATIO, URINE
Creatinine, Urine: 157.09 mg/dL
Protein Creatinine Ratio: 0.1 mg/mg{Cre} (ref 0.00–0.15)
Total Protein, Urine: 16 mg/dL

## 2021-04-29 LAB — CBC WITH DIFFERENTIAL/PLATELET
Abs Immature Granulocytes: 0.13 10*3/uL — ABNORMAL HIGH (ref 0.00–0.07)
Basophils Absolute: 0.1 10*3/uL (ref 0.0–0.1)
Basophils Relative: 0 %
Eosinophils Absolute: 0.3 10*3/uL (ref 0.0–0.5)
Eosinophils Relative: 2 %
HCT: 34.2 % — ABNORMAL LOW (ref 36.0–46.0)
Hemoglobin: 11.3 g/dL — ABNORMAL LOW (ref 12.0–15.0)
Immature Granulocytes: 1 %
Lymphocytes Relative: 14 %
Lymphs Abs: 1.8 10*3/uL (ref 0.7–4.0)
MCH: 29.4 pg (ref 26.0–34.0)
MCHC: 33 g/dL (ref 30.0–36.0)
MCV: 89.1 fL (ref 80.0–100.0)
Monocytes Absolute: 1.1 10*3/uL — ABNORMAL HIGH (ref 0.1–1.0)
Monocytes Relative: 9 %
Neutro Abs: 9.4 10*3/uL — ABNORMAL HIGH (ref 1.7–7.7)
Neutrophils Relative %: 74 %
Platelets: 263 10*3/uL (ref 150–400)
RBC: 3.84 MIL/uL — ABNORMAL LOW (ref 3.87–5.11)
RDW: 12.9 % (ref 11.5–15.5)
WBC: 12.7 10*3/uL — ABNORMAL HIGH (ref 4.0–10.5)
nRBC: 0 % (ref 0.0–0.2)

## 2021-04-29 LAB — TYPE AND SCREEN
ABO/RH(D): A POS
Antibody Screen: NEGATIVE

## 2021-04-29 LAB — COMPREHENSIVE METABOLIC PANEL
ALT: 14 U/L (ref 0–44)
AST: 23 U/L (ref 15–41)
Albumin: 2.6 g/dL — ABNORMAL LOW (ref 3.5–5.0)
Alkaline Phosphatase: 94 U/L (ref 38–126)
Anion gap: 10 (ref 5–15)
BUN: 9 mg/dL (ref 6–20)
CO2: 23 mmol/L (ref 22–32)
Calcium: 9 mg/dL (ref 8.9–10.3)
Chloride: 102 mmol/L (ref 98–111)
Creatinine, Ser: 0.61 mg/dL (ref 0.44–1.00)
GFR, Estimated: >60 mL/min (ref >60)
Glucose, Bld: 74 mg/dL (ref 70–99)
Potassium: 3.9 mmol/L (ref 3.5–5.1)
Sodium: 135 mmol/L (ref 135–145)
Total Bilirubin: 0.3 mg/dL (ref 0.3–1.2)
Total Protein: 5.7 g/dL — ABNORMAL LOW (ref 6.5–8.1)

## 2021-04-29 NOTE — Progress Notes (Signed)
34'1 wks GHTN Severe IUGR  Elevated UAD   Pt denies significant anxiety, just 'gets worried when no-one tells her what's going on." GERD treated, no constipation. Happy to have a visitor today. No HA/ vision changes. Still not having active FMs   BP 136/82 (BP Location: Left Arm)    Pulse 85    Temp 98.3 F (36.8 C) (Oral)    Resp 20    Ht 5\' 6"  (1.676 m)    Wt 89.3 kg    LMP 09/02/2020    SpO2 100%    BMI 31.76 kg/m  Vitals:   04/29/21 0435 04/29/21 0755 04/29/21 1119 04/29/21 1120  BP: (!) 150/85 134/82 136/82   Pulse: 84 82 85   Resp: 18 17 20    Temp:  98 F (36.7 C) 98.3 F (36.8 C)   TempSrc: Oral Oral Oral   SpO2: 99% 100% 100% 100%  Weight:      Height:         A&ox3 Nml respirations Abd: soft,nt,nd, gravid, no RUQ pain LE: trace edema, nt bilat, no clonus, 2+ DTR  NST 1/13 9P: FHT: baseline mostly 130s, 10 beat variability; +accels no decels  TOCO: no ctx  NST 1/14 9a: FHT: baseline 135s, 10 beat variability; +accels no decels  TOCO: no ctx  Bpp: 4/13: cephalic, fundal placenta with succenturiate lobe, MCI, afi 9.7cm; elevated S/D ratio, no aedf, bpp 8/10  1/13: BPP 8/8, AFI 11, SD >97%, no absent or revered EDF  A/P 1.Severe IUGR -Continue fetal surveillance with MFM with UAD twice weekly. Inpt management per MFM consult and pt happy to stay in house. Last growth [place] on 12/28 (EFW 1%) - repeat efw on Mon, add UA dopplers and NST.  -S/p BMZ 12/8-12/9 per MFM no indication for rescue course as delivery does not appear to be imminent before 34 weeks -s/p NICU consult gHTN -S/p PIH labs 1/8 WNL, and repeat 1/14 with nl labs, nl Pr/Cr ratio. Bps trending higher but may be anxiety related, cont to watch closely but no change to meds.  -Cont PO Labetalol 100mg  q8hr  -Cont daily baby ASA 81mg  PEC ppx Anxiety- pt declines meds Routine Antepartum Care -Regular diet -Bowel regimen PRN -May ambulate, SCD while in bed VTE ppx- will start today as no SCDs in the room.   T/S q 3days.  -Daily PNV  Delivery planning: goal of 71 wga, but if patient is having decelerations, can move to delivery since severe IUGR. MOD delivery d/w pt- likey c/s and pt agreeable.   Ala Dach 04/29/2021 2:25 PM

## 2021-04-30 MED ORDER — BUSPIRONE HCL 15 MG PO TABS
7.5000 mg | ORAL_TABLET | Freq: Two times a day (BID) | ORAL | Status: DC | PRN
  Administered 2021-04-30 – 2021-05-08 (×7): 7.5 mg via ORAL
  Filled 2021-04-30 (×9): qty 1

## 2021-04-30 MED ORDER — LACTATED RINGERS IV BOLUS
500.0000 mL | Freq: Once | INTRAVENOUS | Status: AC
  Administered 2021-04-30: 500 mL via INTRAVENOUS

## 2021-04-30 NOTE — Progress Notes (Signed)
34'2wks GHTN Severe IUGR  Elevated UAD   Pt admits to more anxiety today.  Patient asking about possibility of as needed medication.  Patient states anxiety worse at night.  Patient asking for social work consult.  No HA/ vision changes.  No vaginal bleeding, no leaking of fluid, no contractions. still not having active FMs   BP 131/84 (BP Location: Left Arm)    Pulse (!) 101    Temp 98.4 F (36.9 C) (Oral)    Resp 18    Ht 5\' 6"  (1.676 m)    Wt 89.3 kg    LMP 09/02/2020    SpO2 99%    BMI 31.76 kg/m  Vitals:   04/29/21 2209 04/29/21 2322 04/30/21 0345 04/30/21 0631  BP:  127/69 131/84   Pulse: 88 82 74 (!) 101  Resp:  20 18   Temp:  97.9 F (36.6 C) 98.4 F (36.9 C)   TempSrc:  Oral Oral   SpO2:  100% 99%   Weight:      Height:         A&ox3 Nml respirations Abd: soft,nt,nd, gravid, no RUQ pain LE: trace edema, nt bilat, no clonus, 2+ DTR  NST 1/14 10P: FHT: baseline  130s, 10 beat variability; + 15 x 15 accels single 3-minute D-cell with good variability within but dropping to 70 and overall D-cell lasting 3 minutes.  This was followed by 1.5 hours of reactive testing TOCO: no ctx  NST 1/15 3a: FHT: baseline 135s, 10 beat variability; + 15 x 15 accels no decels  TOCO: no ctx  Bpp: 1/49: cephalic, fundal placenta with succenturiate lobe, MCI, afi 9.7cm; elevated S/D ratio, no aedf, bpp 8/10  1/13: BPP 8/8, AFI 11, SD >97%, no absent or revered EDF  A/P 1.Severe IUGR -Continue fetal surveillance with MFM with UAD twice weekly. Inpt management per MFM consult and pt happy to stay in house. Last growth [place] on 12/28 (EFW 1%) - repeat efw on Mon, add UA dopplers and NST.  -S/p BMZ 12/8-12/9 per MFM no indication for rescue course as delivery does not appear to be imminent before 34 weeks -s/p NICU consult gHTN -S/p PIH labs 1/8 WNL, and repeat 1/14 with nl labs, nl Pr/Cr ratio. Bps trending higher but may be anxiety related, cont to watch closely but no change to meds.   -Cont PO Labetalol 100mg  q8hr  -Cont daily baby ASA 81mg  PEC ppx Anxiety-patient agreeable to as needed BuSpar and this ordered today Routine Antepartum Care -Regular diet -Bowel regimen PRN -May ambulate, SCD while in bed VTE ppx- will start today as no SCDs in the room.  T/S q 3days.  -Daily PNV  Delivery planning: goal of 53 wga, but if patient is having decelerations, can move to delivery since severe IUGR. MOD delivery d/w pt- likey c/s and pt agreeable.   Ala Dach 04/30/2021 10:26 AM

## 2021-04-30 NOTE — Progress Notes (Signed)
CSW met with MOB to complete consult for mental health. CSW observed MOB sitting in bed, FOB standing gathering items, and MGM sitting on couch. MOB gave CSW verbal consent to complete consult while FOB, and MGM was present. CSW explained role, and reason for consult. MOB was pleasant, and polite during engagement with CSW. MOB reported, she experience anxiety, depression, PTSD, and panic attacks. MOB reported, last night she had a panic attack during the stress test. MOB reported, she experience being anxious, crying, worrying, over thinking, racing thoughts, and insomnia. MOB reported, history of therapy at private practice with Phineas Real. MOB reported, her last session with Mr. Izetta Dakin was two weeks ago. MOB reported, in the past she was prescribe Lamictal, Zoloft, and Xanax. MOB reported, prior to admission she was able to manage symptoms without medication. However, she has been persuade to take Buspar to treat her symptoms.CSW encourage MOB to implement healthy coping skills when symptoms arises. MOB reported, FOB, and her mother are very supportive. MOB denied SI, HI, when CSW assessed for safety.   CSW provided education regarding the baby blues period vs. perinatal mood disorders, discussed treatment and gave resources for mental health follow up if concerns arise. CSW recommends self- evaluation during the postpartum time period using the New Mom Checklist from Postpartum Progress and encouraged MOB to contact a medical professional if symptoms are noted at any time.   MOB reported, infant's pediatric[facility] will [facility] at [facility]. MOB denied any transportation barriers to follow up infant's care. MOB reported, infant has a car seat, bassinet, and crib.   CSW provided education on sudden infant death syndrome (SIDS).  CSW provided perinatal mood disorders resources.   CSW will continue to offer resources, and on going support during admission.   Darcus Austin, MSW, LCSW-A Clinical  Social Worker- Weekends [phone]

## 2021-04-30 NOTE — Progress Notes (Signed)
Tracing Note  Patient had prolonged FHT monitoring with afternoon NST due to presence of 2 variable decelerations. Patient has now been on continuous monitoring for 2 hours without any additional decelerations. Category I tracing with moderate variability and presence of accelerations. No uterine activity on Tocometry. Ok to come off EFM and resume NST q shift fetal monitoring. Plan of care reviewed with RN.  Calinda Stockinger A Seniya Stoffers 04/30/21 8:02 PM

## 2021-05-01 NOTE — Progress Notes (Signed)
Kadence Mikkelson 36 y.o. G3P0020 at [redacted]w[redacted]d HD#7 admitted with decreased FM, severe IUGR, and gHTN  S: Pt doing better this morning. Admits to a few anxious episodes over the weekend but feels she is in a good place now. The FM has not improved, feeling him less today but then felt 4 good mvmts as we talked. Denies any CTXs, VB, or LOF. No c/o HA, vision changes, CP, SOB, RUQ/epigastric pain. No N/V.   Fiance [name] at bedside this morning   O: Vitals:   05/01/21 0345 05/01/21 0600 05/01/21 0915 05/01/21 1155  BP: 121/65 129/69 132/75 129/80  Pulse: 79 81 85 83  Resp: 17 17 17 18   Temp: 97.9 F (36.6 C)  98.6 F (37 C) 97.8 F (36.6 C)  TempSrc: Oral  Oral Oral  SpO2: 100%  100% 100%  Weight:      Height:       Physical Exam: -General: AAO, NAD -Heart: regular rate -Lungs: normal effort -Abdomen: gravid uterus -Extremities: no LE edema   Fetal Monitoring: -NST: reactive this morning baseline 135 bpm mod var +accels 15x15's -decels -Toco: acontractile   12/28 EFW <1% 0/07 BPP 8/8 cephalic, UAD elevated S/D ratio but no evidence of absent or reverse flow  A/P:  Marcena Dias 36 y.o. G3P0020 at [redacted]w[redacted]d HD#7 admitted with decreased FM, severe IUGR, and gHTN, now clinically stable without evidence of fetal distress  Severe IUGR -Continue fetal surveillance with MFM with UAD twice weekly- next scheduled for Tuesday 1/17 with growth scan -NST q shift- reactive last night and this morning, pt did require prolonged FHT monitoring last night for intermittent variable decels, no decels this morning. Pt reassured -S/p BMZ 12/8-12/9 -S/p NICU consult gHTN -S/p PIH labs 1/4 WNL, consider repeat weekly or sooner as indicated -Cont home regimen of PO Labetalol 100mg  q8hr and titrate as indicated, Bps well controlled -Cont daily baby ASA 81mg  PEC ppx Anxiety -S/p SW consult -Pt declines pharmacotherapy, consider PP  Routine Antepartum Care -Regular diet -Bowel regimen PRN. Pepcid  BID for  acid reflux  -May ambulate, SCD while in bed VTE ppx -Daily PNV  Continue inpatient management until delivery as previously discussed with MFM. Goal delivery of 36w or sooner if abnormal dopplers or recurrent FHR decelerations. Planning MOD with primary cesarean section  Patient and fiance questions answered at bedside   Aleshka Corney A Doak Mah 05/01/21 12:14 PM

## 2021-05-01 NOTE — Progress Notes (Signed)
Chaplain notes consult request. Chaplain attempted to visit pt, but she was not in the room when chaplain came to room. Will continue to follow.  Please page as further needs arise.  Donald Prose. Elyn Peers, M.Div. [facility] Chaplain Pager [phone] Office [phone]

## 2021-05-01 NOTE — Progress Notes (Signed)
Initial visit with [name] and [name]. Chaplain introduced spiritual care services and provided education regarding chaplain support and education/training. Chaplain asked open ended questions to facilitate emotional expression and story telling.  Melanie Cooley shared about the difficulties of this pregnancy, which follows a miscarriage last year. She reports several experiences where her intuition told her something was wrong and she was told she was worrying too much only to find out there were issues with the baby's growth as well as Melanie Cooley's blood pressure. Chaplain affirmed her desire to trust her own intuition and finding ways to rebuild that relationship and find some empowerment in her remaining days of pregnancy. Chaplain and pt and family explored coping skills to manage stress including expanding her knowledge base and practicing exposure to her fears. Chaplain engaged in therapeutic listening and assisted pt with identifying strengths and facilitating communication with the medical team. Melanie Cooley and Melanie Cooley are awaiting another growth scan tomorrow and the information it will provide regarding whether Melanie Cooley will be delivered next week or earlier.  Please page as further needs arise.  Donald Prose. Elyn Peers, M.Div. [facility] Chaplain Pager [phone] Office [phone]

## 2021-05-01 NOTE — Progress Notes (Signed)
Follow up. Pt out of the room. Consult with RN, Almyra Free. Pt off unit. Will attempt to visit again.  Please page as further needs arise.  Donald Prose. Elyn Peers, M.Div. [facility] Chaplain Pager [phone] Office [phone]

## 2021-05-01 NOTE — Progress Notes (Signed)
Called by RN to review tracing. FHR reassuring BTBV >5, occ accel, no decels noted.

## 2021-05-02 ENCOUNTER — Inpatient Hospital Stay (HOSPITAL_BASED_OUTPATIENT_CLINIC_OR_DEPARTMENT_OTHER): Payer: Commercial Managed Care - HMO

## 2021-05-02 ENCOUNTER — Ambulatory Visit: Payer: Managed Care, Other (non HMO)

## 2021-05-02 DIAGNOSIS — O365930 Maternal care for other known or suspected poor fetal growth, third trimester, not applicable or unspecified: Secondary | ICD-10-CM | POA: Diagnosis not present

## 2021-05-02 DIAGNOSIS — Z3A34 34 weeks gestation of pregnancy: Secondary | ICD-10-CM | POA: Diagnosis not present

## 2021-05-02 DIAGNOSIS — O133 Gestational [pregnancy-induced] hypertension without significant proteinuria, third trimester: Secondary | ICD-10-CM | POA: Diagnosis not present

## 2021-05-02 MED ORDER — MAGNESIUM OXIDE -MG SUPPLEMENT 400 (240 MG) MG PO TABS
400.0000 mg | ORAL_TABLET | Freq: Every day | ORAL | Status: DC
  Filled 2021-05-02 (×3): qty 1

## 2021-05-02 MED ORDER — MAGNESIUM OXIDE -MG SUPPLEMENT 400 (240 MG) MG PO TABS: 200.0000 mg | ORAL_TABLET | Freq: Every day | ORAL | Status: DC

## 2021-05-02 NOTE — Progress Notes (Signed)
Initial Nutrition Assessment  DOCUMENTATION CODES:   Not applicable  INTERVENTION:  Regular Diet Pt may order double protein portions and snacks TID if she makes request when ordering meals   NUTRITION DIAGNOSIS:   Increased nutrient needs related to  (pregnancy and fetal growth requirements) as evidenced by  (34 weeks IUP).  GOAL:   Patient will meet greater than or equal to 90% of their needs  MONITOR:   Weight trends  REASON FOR ASSESSMENT:   Antenatal, LOS    ASSESSMENT:   Now 34 4/7 weeks IUP, adm due to IUGR and gHTN. pre-pregnancy wt ~76.2 kg, BMI 27. 28 lb weight gain. On prenatal vits   Diet Order:   Diet Order             Diet regular Room service appropriate? Yes; Fluid consistency: Thin  Diet effective now                   EDUCATION NEEDS:   No education needs have been identified at this time  Skin:  Skin Assessment: Reviewed RN Assessment    Height:   Ht Readings from Last 1 Encounters:  04/25/21 5\' 6"  (1.676 m)    Weight:   Wt Readings from Last 1 Encounters:  04/25/21 89.3 kg    Ideal Body Weight:   130 lbs  BMI:  Body mass index is 31.76 kg/m.  Estimated Nutritional Needs:   Kcal:  1800-2000  Protein:  80-90 g  Fluid:  >2.1 L

## 2021-05-02 NOTE — Progress Notes (Signed)
Myah Guynes 36 y.o. G3P0020 at [redacted]w[redacted]d HD#8 admitted with decreased FM, severe IUGR, and gHTN  S: Pt feeling well today. The FM has improved last night per RN but notes chronic decrease. Denies any CTXs, VB, or LOF. No c/o HA, vision changes, CP, SOB, RUQ/epigastric pain. No N/V.    O: Vitals:   05/01/21 1555 05/01/21 2001 05/02/21 0544 05/02/21 0730  BP: 135/80 130/84 124/70 133/88  Pulse: 83 81 79 85  Resp: 18 17 18 18   Temp: 97.6 F (36.4 C) 98 F (36.7 C) 98.5 F (36.9 C)   TempSrc: Oral Oral Oral   SpO2: 100% 100% 100% 99%  Weight:      Height:       Physical Exam: -General: AAO, NAD -Heart: regular rate -Lungs: normal effort -Abdomen: gravid uterus -Extremities: no LE edema -Neuro: nonfocal -Skin: intact   Fetal Monitoring: -NST: reactive this morning baseline 135 bpm mod var +accels 15x15's -decels -Toco: acontractile   12/28 EFW <1% 6/81 BPP 8/8 cephalic, UAD elevated S/D ratio but no evidence of absent or reverse flow 1/17- BPP and growth sono results pending.  A/P:  Takeisha Cianci 36 y.o. G3P0020 at [redacted]w[redacted]d HD#8 admitted with decreased FM, severe IUGR, and gHTN, now clinically stable without evidence of fetal distress  Severe IUGR -Continue fetal surveillance with MFM with UAD twice weekly- with fu growth scan results pending today -NST q shift- reactive last night and this morning -S/p BMZ 12/8-12/9 -S/p NICU consult gHTN -S/p PIH labs 1/4 WNL, consider repeat weekly or sooner as indicated -Cont home regimen of PO Labetalol 100mg  q8hr and titrate as indicated, Bps well controlled -Cont daily baby ASA 81mg  PEC ppx Anxiety -S/p SW consult -Pt declines pharmacotherapy, consider PP treatment  Continue inpatient management until delivery as previously discussed with MFM. Goal delivery of 36w or sooner if abnormal dopplers or recurrent FHR decelerations. Planning MOD with primary cesarean section  Patient questions answered at bedside   Jodene Polyak  J 05/02/21 10:02 AM

## 2021-05-02 NOTE — Progress Notes (Signed)
NST reviewed  130-140s/ moderate variability, + 15x15 accels, no decels- reactive NST No UCs   BP (!) 145/88 (BP Location: Left Arm)    Pulse 87    Temp 98.2 F (36.8 C) (Oral)    Resp 18    Ht 5\' 6"  (1.676 m)    Wt 89.3 kg    LMP 09/02/2020    SpO2 99%    BMI 31.76 kg/m

## 2021-05-03 MED ORDER — SODIUM CHLORIDE 0.9% FLUSH
3.0000 mL | Freq: Two times a day (BID) | INTRAVENOUS | Status: DC
  Administered 2021-05-03 – 2021-05-04 (×2): 3 mL via INTRAVENOUS

## 2021-05-03 MED ORDER — SODIUM CHLORIDE 0.9% FLUSH
3.0000 mL | Freq: Two times a day (BID) | INTRAVENOUS | Status: DC
  Administered 2021-05-05: 3 mL via INTRAVENOUS

## 2021-05-03 MED ORDER — SODIUM CHLORIDE 0.9% FLUSH: 3.0000 mL | INTRAVENOUS | Status: DC | PRN

## 2021-05-03 NOTE — Progress Notes (Signed)
Chaplain provided support to pt through reflective listening while walking in the hallway. Pt shared that she received the official report from Kai's scan yesterday and thinks that she will be able to remain pregnant until next week when she remains 36 weeks. She also learned that her higher blood pressure readings may be related to her body compensating for Kai's increased needs and his cord issues. She is working to accept the possibility of a cesarean section and finding ways she can participate in medical decision making to remind herself that she is not powerless in this difficult situation.  Please page as further needs arise.  Donald Prose. Elyn Peers, M.Div. [facility] Chaplain Pager [phone] Office [phone]

## 2021-05-03 NOTE — Progress Notes (Signed)
Laveta Gilkey 36 y.o. G3P0020 at [redacted]w[redacted]d HD#8 admitted with decreased FM, severe IUGR, and GHTN  S: Pt feeling well today. +FMs No neural symptoms. No UCs.    O: Patient Vitals for the past 24 hrs:  BP Temp Temp src Pulse Resp SpO2  05/03/21 0823 129/77 98 F (36.7 C) Oral 85 18 98 %  05/03/21 0257 140/88 97.6 F (36.4 C) Oral 80 16 98 %  05/02/21 2342 134/69 97.6 F (36.4 C) Oral 85 16 99 %  05/02/21 2013 (!) 152/87 98.3 F (36.8 C) Oral 84 16 99 %  05/02/21 1621 (!) 145/88 98.2 F (36.8 C) Oral 87 18 99 %  05/02/21 1138 130/80 98 F (36.7 C) Oral 84 18 100 %    Physical Exam: -General: AAO, NAD -Heart: regular rate -Lungs: normal effort -Abdomen: gravid uterus -Extremities: no LE edema -Neuro: nonfocal -Skin: intact   Fetal Monitoring: -NST:  baseline 135 bpm mod var +accels 15x15's -decels -Toco: acontractile   12/28 EFW <1% 3/24 BPP 8/8 cephalic, UAD elevated S/D ratio but no evidence of absent or reverse flow 1/17- Growth-  1752  gm 3 lb 14 oz  < 1  %, AC <1%, AFI 8.5 cm, Vx. UAD S/D elevated but no absent/reverse. BPP 8/8 . Small umbilical cord varix noted.   A/P:  Ludie Hudon 36 y.o. G3P0020 at [redacted]w[redacted]d HD#8 admitted with decreased FM, severe IUGR, and gHTN, now clinically stable without evidence of fetal distress  Severe IUGR -Continue fetal surveillance with MFM with UAD twice weekly -NST q shift- reactive last night and this morning -S/p BMZ 12/8-12/9 -S/p NICU consult GHTN -PIH labs 1/4 WNL, repeat weekly or sooner as indicated -Cont PO Labetalol 100mg  q8hr and titrate as indicated, BPs well controlled -Cont daily baby ASA 81mg  PEC ppx Anxiety -S/p SW consult -Pt declines pharmacotherapy, consider PP treatment  Continue inpatient management until delivery as previously discussed with MFM. Goal delivery of 36w or sooner if abnormal dopplers or recurrent FHR decelerations. Planning MOD with primary cesarean section  Patient's and husband questions esp w/  [place] report details incl UAD, S/D ratio, varix etc answered.  Elveria Royals 05/03/21 7:32 AM

## 2021-05-04 NOTE — Progress Notes (Signed)
Frederika Hukill 36 y.o. G3P0020 at [redacted]w[redacted]d HD#9 admitted with decreased FM, severe IUGR, and GHTN  S: Pt feeling well today. Can only feel fetal hiccups and FM when monitor is on and band is tight, otherwise not feeling FM. No ctx, no LOF, no VB, no complaints.    O: Patient Vitals for the past 24 hrs:  BP Temp Temp src Pulse Resp SpO2  05/04/21 1546 (!) 141/84 98 F (36.7 C) Oral 85 18 99 %  05/04/21 1225 131/83 98.2 F (36.8 C) Oral 84 18 99 %  05/04/21 0911 124/75 98.7 F (37.1 C) Oral 92 16 99 %  05/04/21 0554 138/81 98.2 F (36.8 C) Oral 89 -- --  05/03/21 2000 (!) 140/91 98.3 F (36.8 C) Oral 86 16 99 %     Physical Exam: -General: AAO, NAD -Heart: regular rate -Lungs: normal effort -Abdomen: gravid uterus -Extremities: no LE edema -Neuro: nonfocal -Skin: intact   Fetal Monitoring: -NST 6 am 05/04/20:  baseline 125 bpm 10 beat variability, positive accels 15x15's, no decels -Toco: acontractile  NST 9a, 1/19: baseline 130s, 10 beat variabilty, minimal but adequate 15x15 accels, no decels, Reactive - toco none   12/28 EFW <1% 1/61 BPP 8/8 cephalic, UAD elevated S/D ratio but no evidence of absent or reverse flow 1/17- Growth-  1752  gm 3 lb 14 oz  < 1  %, AC <1%, AFI 8.5 cm, Vx. UAD S/D elevated but no absent/reverse. BPP 8/8 . Small umbilical cord varix noted.   A/P:  Tamisha Nordstrom 36 y.o. G3P0020 at [redacted]w[redacted]d HD#9 admitted with decreased FM, severe IUGR, and gHTN, now clinically stable without evidence of fetal distress but elevated UA dopplers,   Severe IUGR -Continue fetal surveillance with MFM with UAD twice weekly -NST q shift- reactive last 24 hrs.  -S/p BMZ 12/8-12/9, no indication for repeat -S/p NICU consult GHTN -[place] labs 1/14 WNL, repeat weekly or sooner as indicated -Cont PO Labetalol 100mg  q8hr and titrate as indicated, BPs well controlled -Cont daily baby ASA 81mg  PEC ppx Anxiety -S/p SW consult - pt thinks Buspar is helping though causes fatigue. Will  cont prn.   Continue inpatient management until delivery as previously discussed with MFM. Goal delivery of 36w or sooner if abnormal dopplers or recurrent FHR decelerations. Planning MOD with primary cesarean section   Ala Dach 05/04/21 4:11 PM

## 2021-05-05 ENCOUNTER — Other Ambulatory Visit: Payer: Self-pay | Admitting: Obstetrics and Gynecology

## 2021-05-05 ENCOUNTER — Ambulatory Visit: Payer: Managed Care, Other (non HMO)

## 2021-05-05 ENCOUNTER — Inpatient Hospital Stay (HOSPITAL_BASED_OUTPATIENT_CLINIC_OR_DEPARTMENT_OTHER): Payer: Commercial Managed Care - HMO

## 2021-05-05 DIAGNOSIS — O365930 Maternal care for other known or suspected poor fetal growth, third trimester, not applicable or unspecified: Secondary | ICD-10-CM

## 2021-05-05 DIAGNOSIS — Z3A35 35 weeks gestation of pregnancy: Secondary | ICD-10-CM

## 2021-05-05 DIAGNOSIS — O133 Gestational [pregnancy-induced] hypertension without significant proteinuria, third trimester: Secondary | ICD-10-CM

## 2021-05-05 DIAGNOSIS — O09523 Supervision of elderly multigravida, third trimester: Secondary | ICD-10-CM | POA: Diagnosis not present

## 2021-05-05 LAB — CBC WITH DIFFERENTIAL/PLATELET
Abs Immature Granulocytes: 0.13 10*3/uL — ABNORMAL HIGH (ref 0.00–0.07)
Basophils Absolute: 0 10*3/uL (ref 0.0–0.1)
Basophils Relative: 0 %
Eosinophils Absolute: 0.2 10*3/uL (ref 0.0–0.5)
Eosinophils Relative: 2 %
HCT: 35.4 % — ABNORMAL LOW (ref 36.0–46.0)
Hemoglobin: 12.1 g/dL (ref 12.0–15.0)
Immature Granulocytes: 1 %
Lymphocytes Relative: 13 %
Lymphs Abs: 1.5 10*3/uL (ref 0.7–4.0)
MCH: 30.3 pg (ref 26.0–34.0)
MCHC: 34.2 g/dL (ref 30.0–36.0)
MCV: 88.5 fL (ref 80.0–100.0)
Monocytes Absolute: 1.2 10*3/uL — ABNORMAL HIGH (ref 0.1–1.0)
Monocytes Relative: 10 %
Neutro Abs: 8.6 10*3/uL — ABNORMAL HIGH (ref 1.7–7.7)
Neutrophils Relative %: 74 %
Platelets: 253 10*3/uL (ref 150–400)
RBC: 4 MIL/uL (ref 3.87–5.11)
RDW: 13.1 % (ref 11.5–15.5)
WBC: 11.7 10*3/uL — ABNORMAL HIGH (ref 4.0–10.5)
nRBC: 0 % (ref 0.0–0.2)

## 2021-05-05 LAB — COMPREHENSIVE METABOLIC PANEL
ALT: 17 U/L (ref 0–44)
AST: 21 U/L (ref 15–41)
Albumin: 2.5 g/dL — ABNORMAL LOW (ref 3.5–5.0)
Alkaline Phosphatase: 106 U/L (ref 38–126)
Anion gap: 9 (ref 5–15)
BUN: 13 mg/dL (ref 6–20)
CO2: 23 mmol/L (ref 22–32)
Calcium: 9 mg/dL (ref 8.9–10.3)
Chloride: 103 mmol/L (ref 98–111)
Creatinine, Ser: 0.58 mg/dL (ref 0.44–1.00)
GFR, Estimated: >60 mL/min (ref >60)
Glucose, Bld: 81 mg/dL (ref 70–99)
Potassium: 4.1 mmol/L (ref 3.5–5.1)
Sodium: 135 mmol/L (ref 135–145)
Total Bilirubin: 0.3 mg/dL (ref 0.3–1.2)
Total Protein: 5.7 g/dL — ABNORMAL LOW (ref 6.5–8.1)

## 2021-05-05 LAB — TYPE AND SCREEN
ABO/RH(D): A POS
Antibody Screen: NEGATIVE

## 2021-05-05 LAB — URIC ACID: Uric Acid, Serum: 5.7 mg/dL (ref 2.5–7.1)

## 2021-05-05 NOTE — Progress Notes (Signed)
Melanie Cooley 36 y.o. G3P0020 at 35wga HD#10 admitted with decreased FM, severe IUGR, and GHTN  No c/o; doesn't really feel FM, can see movement on BPP but doesn't feel it No ctx/vb/lof  Patient Vitals for the past 24 hrs:  BP Temp Temp src Pulse Resp SpO2  05/05/21 0748 135/84 97.9 F (36.6 C) Oral 86 19 99 %  05/05/21 0431 (!) 143/78 97.9 F (36.6 C) Oral 83 20 98 %  05/04/21 2258 129/69 -- -- 80 -- --  05/04/21 1943 (!) 146/91 98.3 F (36.8 C) Oral 79 18 100 %  05/04/21 1546 (!) 141/84 98 F (36.7 C) Oral 85 18 99 %  05/04/21 1225 131/83 98.2 F (36.8 C) Oral 84 18 99 %   A&ox3 Nml respirations Abd: soft, nt, gravid LE: no edema, nt bilat  NST x3 reveiwed FHT: 130s, nml variability; accels; there was a 3 min decel to 90s both at 2130 and 0820.  Return to baseline.  After first decel, there was another 51min of tracing and stopped nst during accel; nst was then started for am and decel noted; back to baseline, had 36min of strip after decel then pt removed for bpp (per nurse) Of note, I was on call for both decels and not notified; pt is now on continuous monitoring and instructions for on call md to be informed of any decels  BPP today, final results pending; prelim bpp 8/8, elevated s/d with no aedf or reversed flow  A/P: Severe IUGR -Continue fetal surveillance with MFM with UAD twice weekly -NST q shift- reactive last 24 hrs, now continuous to follow if decels are persisting, if so may need move to delivery, planning per DR Law who is now on call -S/p BMZ 12/8-12/9, no indication for repeat -S/p NICU consult GHTN -[place] labs 1/14 WNL, repeat weekly or sooner as indicated -Cont PO Labetalol 100mg  q8hr and titrate as indicated, BPs well controlled -Cont daily baby ASA 81mg  PEC ppx Anxiety -S/p SW consult - pt thinks Buspar is helping though causes fatigue. Will cont prn.    Continue inpatient management until delivery as previously discussed with MFM. Goal delivery of  36w or sooner if abnormal dopplers or recurrent FHR decelerations. Planning MOD with primary cesarean section

## 2021-05-06 MED ORDER — SIMETHICONE 80 MG PO CHEW
160.0000 mg | CHEWABLE_TABLET | Freq: Four times a day (QID) | ORAL | Status: DC | PRN
  Administered 2021-05-06: 160 mg via ORAL
  Filled 2021-05-06: qty 2

## 2021-05-06 NOTE — Progress Notes (Signed)
Tracing Note  Received call from RN stating patient having some vague abdominal discomforts. Pt attributing symptoms to indigestion related to what she had for dinner this evening. Has taken Tums but declined Pepcid. Patient placed on EFM/Toco- strip reviewed- cat I baseline 130 bpm min-mod variability w/ presence of accels, no decels. No ctxs on toco. Pt had also been on monitor earlier this evening around 2100 for evening NST- also reactive baseline 130 bpm mod var +accles, -decels and no evidence of ctx at that time. Advised RN ok to take off monitors. Encourage bowel regimen. Order placed for Simethicone PRN as well.  Dennard Vezina A Dayshawn Irizarry 05/06/21 12:21 AM

## 2021-05-06 NOTE — Progress Notes (Signed)
Melanie Cooley 36 y.o. G3P0020 at [redacted]w[redacted]d HD#11 admitted with decreased FM in setting of severe IUGR and gHTN  S: Patient doing very well this morning. Feeling good about having scheduled c/s date next Friday. Fiance [name] at bedside, they have questions regarding c/s details and expectations. Did have some abdominal pain and loose stools last night, but attribute this to some bad takeout as both patient and fiance had similar GI symptoms. Feeling better today. No complaints.  O: Vitals:   05/06/21 0620 05/06/21 0747 05/06/21 1219 05/06/21 1541  BP: 121/69 126/73 (!) 132/91 137/86  Pulse: 85 94 83 86  Resp: 18 18 18 18   Temp: 98 F (36.7 C) 97.9 F (36.6 C) (!) 97.5 F (36.4 C) (!) 97.4 F (36.3 C)  TempSrc: Oral Oral Oral Oral  SpO2:  99% 98%   Weight:      Height:       CBC    Component Value Date/Time   WBC 11.7 (H) 05/05/2021 0520   RBC 4.00 05/05/2021 0520   HGB 12.1 05/05/2021 0520   HCT 35.4 (L) 05/05/2021 0520   PLT 253 05/05/2021 0520   MCV 88.5 05/05/2021 0520   MCH 30.3 05/05/2021 0520   MCHC 34.2 05/05/2021 0520   RDW 13.1 05/05/2021 0520   LYMPHSABS 1.5 05/05/2021 0520   MONOABS 1.2 (H) 05/05/2021 0520   EOSABS 0.2 05/05/2021 0520   BASOSABS 0.0 05/05/2021 0520   CMP     Component Value Date/Time   NA 135 05/05/2021 0520   K 4.1 05/05/2021 0520   CL 103 05/05/2021 0520   CO2 23 05/05/2021 0520   GLUCOSE 81 05/05/2021 0520   BUN 13 05/05/2021 0520   CREATININE 0.58 05/05/2021 0520   CALCIUM 9.0 05/05/2021 0520   PROT 5.7 (L) 05/05/2021 0520   ALBUMIN 2.5 (L) 05/05/2021 0520   AST 21 05/05/2021 0520   ALT 17 05/05/2021 0520   ALKPHOS 106 05/05/2021 0520   BILITOT 0.3 05/05/2021 0520   GFRNONAA >60 05/05/2021 0520   GFRAA >60 07/19/2019 8101   Physical Exam: -General: AAO, NAD -Heart: regular rate -Lungs: normal effort -Abdomen: gravid uterus -Extremities: no LE edema   Fetal Monitoring: -NST: reactive this morning baseline 130 bpm mod var  +accels 15x15's -decels -Toco: acontractile  [place] 1/17 EFW 7#51 <0% and umbilical vein verix noted, normal fluid and normal UAD BPP 8/8 2/58 Cephalic, BPP 8/8, fluid subjectively low but objectively normal and UAD elevated S/D ratio but no evidence of absent or reverse flow  A/P: Melanie Cooley 36 y.o. G3P0020 at [redacted]w[redacted]d HD#11 admitted with decreased FM in setting of severe IUGR and gHTN, now clinically stable without evidence of fetal distress  Severe IUGR -EFW remains at <1% but interval growth noted on f/u US earlier this week, continue fetal surveillance with MFM with UAD twice weekly- next scheduled for Tuesday 1/24 -NST q shift- reactive last night and this morning -S/p BMZ 12/8-12/9 -S/p NICU consult gHTN -S/p [facility] labs 1/20 WNL, consider repeat weekly or sooner as indicated -Cont home regimen of PO Labetalol 100mg  q8hr and titrate as indicated, Bps well controlled -Cont daily baby ASA 81mg  PEC ppx Anxiety -S/p SW consult -Pt reports coping well. Using Buspar PRN which helps Routine Antepartum Care -Regular diet -Bowel regimen PRN. Pepcid  BID for acid reflux  -May ambulate, SCD while in bed VTE ppx -Daily PNV  Remain inpatient until delivery. Scheduled for primary c/s on Friday 1/27 at 36.0 per MFM recommendations. Patient previously  counseled on MOD and agrees with scheduled c/s as best option for her given fetal status. Patient and husband questions regarding delivery answered to their satisfaction. They understand earlier delivery if indicated by non-reassuring fetal status.    Melanie Cooley A Melanie Cooley 05/06/21 5:07 PM

## 2021-05-07 NOTE — Progress Notes (Signed)
Melanie Cooley 36 y.o. G3P0020 at [redacted]w[redacted]d HD#12 admitted with decreased FM, severe IUGR, and gHTN  S: Patient doing well this morning with family and fiance visiting at bedside. She does report episode of sudden feelings of dizziness while at rest, eating breakfast this morning. Denies any HA, CP, or SOB. Has episodes of blurry vision but no acute changes. Denies any N/V. Denies any abdominal pain, CTXs, VB, or LOF. FM unchanged- appreciates during monitoring but not as much in between.   O: Vitals:   05/06/21 1956 05/06/21 2335 05/07/21 0432 05/07/21 0806  BP: (!) 146/93 118/70 126/78 131/87  Pulse: 83 84 85 86  Resp: 18 17 18 18   Temp: 98.5 F (36.9 C) 98.4 F (36.9 C) 98.7 F (37.1 C) 98.4 F (36.9 C)  TempSrc: Oral Oral Oral Oral  SpO2: 100% 99% 99% 99%  Weight:      Height:       CBC Latest Ref Rng & Units 05/05/2021 04/29/2021 04/23/2021  WBC 4.0 - 10.5 K/uL 11.7(H) 12.7(H) 11.2(H)  Hemoglobin 12.0 - 15.0 g/dL 12.1 11.3(L) 11.2(L)  Hematocrit 36.0 - 46.0 % 35.4(L) 34.2(L) 33.9(L)  Platelets 150 - 400 K/uL 253 263 292   CMP Latest Ref Rng & Units 05/05/2021 04/29/2021 04/23/2021  Glucose 70 - 99 mg/dL 81 74 94  BUN 6 - 20 mg/dL 13 9 11   Creatinine 0.44 - 1.00 mg/dL 0.58 0.61 0.55  Sodium 135 - 145 mmol/L 135 135 133(L)  Potassium 3.5 - 5.1 mmol/L 4.1 3.9 4.3  Chloride 98 - 111 mmol/L 103 102 104  CO2 22 - 32 mmol/L 23 23 20(L)  Calcium 8.9 - 10.3 mg/dL 9.0 9.0 8.9  Total Protein 6.5 - 8.1 g/dL 5.7(L) 5.7(L) 6.0(L)  Total Bilirubin 0.3 - 1.2 mg/dL 0.3 0.3 0.3  Alkaline Phos 38 - 126 U/L 106 94 90  AST 15 - 41 U/L 21 23 20   ALT 0 - 44 U/L 17 14 15    Physical Exam: -General: AAO, NAD -Heart: RRR no audible murmurs -Lungs: CTABL no audible wheezes, rales, or crackles -Abdomen: gravid uterus, non-tender to palpation -Extremities: no LE edema   Fetal Monitoring: -NST: reactive this morning baseline 135 bpm mod var +accels 15x15's -decels -Toco: acontractile   [place] 1/17 EFW 7#20  <9% and umbilical vein verix noted, normal fluid and normal UAD BPP 8/8 4/70 Cephalic, BPP 8/8, fluid subjectively low but objectively normal and UAD elevated S/D ratio but no evidence of absent or reverse flow  A/P: Melanie Cooley 36 y.o. G3P0020 at [redacted]w[redacted]d HD#12 admitted with decreased FM, severe IUGR, and gHTN, now clinically stable with no evidence of fetal distress   Severe IUGR -EFW remains at <1% but interval growth noted on f/u US earlier this week, continue fetal surveillance with MFM with UAD twice weekly- next scheduled for Tuesday 1/24 -NST q shift- reactive last night and this morning -S/p BMZ 12/8-12/9 -S/p NICU consult gHTN -S/p [facility] labs 1/20 WNL, ordered repeat labs on morning of scheduled c/s but consider sooner if indicated -Cont home regimen of PO Labetalol 100mg  q8hr and titrate as indicated, Bps well controlled -Cont daily baby ASA 81mg  PEC ppx Anxiety -S/p SW consult -Pt reports coping well. Using Buspar PRN which helps Routine Antepartum Care -Regular diet -Bowel regimen PRN. Pepcid  BID for acid reflux  -May ambulate, SCD while in bed VTE ppx -Daily PNV  We reviewed delivery expectations and answered additional delivery questions with fiance and family at bedside. Remain inpatient until scheduled  primary c/s on Friday 1/27 at 36.0 or sooner if clinically indicated by abnormal UAD or NRFHT   Kermitt Harjo A [name] 05/07/21 12:20 PM

## 2021-05-08 DIAGNOSIS — O365990 Maternal care for other known or suspected poor fetal growth, unspecified trimester, not applicable or unspecified: Secondary | ICD-10-CM | POA: Diagnosis present

## 2021-05-08 LAB — COMPREHENSIVE METABOLIC PANEL
ALT: 16 U/L (ref 0–44)
AST: 25 U/L (ref 15–41)
Albumin: 2.8 g/dL — ABNORMAL LOW (ref 3.5–5.0)
Alkaline Phosphatase: 122 U/L (ref 38–126)
Anion gap: 8 (ref 5–15)
BUN: 13 mg/dL (ref 6–20)
CO2: 22 mmol/L (ref 22–32)
Calcium: 9.1 mg/dL (ref 8.9–10.3)
Chloride: 102 mmol/L (ref 98–111)
Creatinine, Ser: 0.6 mg/dL (ref 0.44–1.00)
GFR, Estimated: >60 mL/min (ref >60)
Glucose, Bld: 89 mg/dL (ref 70–99)
Potassium: 3.9 mmol/L (ref 3.5–5.1)
Sodium: 132 mmol/L — ABNORMAL LOW (ref 135–145)
Total Bilirubin: 0.4 mg/dL (ref 0.3–1.2)
Total Protein: 6.2 g/dL — ABNORMAL LOW (ref 6.5–8.1)

## 2021-05-08 LAB — TYPE AND SCREEN
ABO/RH(D): A POS
Antibody Screen: NEGATIVE

## 2021-05-08 LAB — CBC
HCT: 34.8 % — ABNORMAL LOW (ref 36.0–46.0)
Hemoglobin: 11.6 g/dL — ABNORMAL LOW (ref 12.0–15.0)
MCH: 29.7 pg (ref 26.0–34.0)
MCHC: 33.3 g/dL (ref 30.0–36.0)
MCV: 89.2 fL (ref 80.0–100.0)
Platelets: 265 10*3/uL (ref 150–400)
RBC: 3.9 MIL/uL (ref 3.87–5.11)
RDW: 13.2 % (ref 11.5–15.5)
WBC: 12.1 10*3/uL — ABNORMAL HIGH (ref 4.0–10.5)
nRBC: 0 % (ref 0.0–0.2)

## 2021-05-08 MED ORDER — POVIDONE-IODINE 10 % EX SWAB
2.0000 "application " | Freq: Once | CUTANEOUS | Status: AC
  Administered 2021-05-09: 2 via TOPICAL

## 2021-05-08 MED ORDER — LABETALOL HCL 5 MG/ML IV SOLN
40.0000 mg | INTRAVENOUS | Status: DC | PRN
  Administered 2021-05-08: 40 mg via INTRAVENOUS
  Filled 2021-05-08: qty 8

## 2021-05-08 MED ORDER — LABETALOL HCL 5 MG/ML IV SOLN
20.0000 mg | INTRAVENOUS | Status: DC | PRN
  Administered 2021-05-08 – 2021-05-09 (×2): 20 mg via INTRAVENOUS
  Filled 2021-05-08 (×2): qty 4

## 2021-05-08 MED ORDER — LACTATED RINGERS IV SOLN: INTRAVENOUS | Status: DC

## 2021-05-08 MED ORDER — LABETALOL HCL 100 MG PO TABS
100.0000 mg | ORAL_TABLET | Freq: Once | ORAL | Status: AC
  Administered 2021-05-08: 100 mg via ORAL
  Filled 2021-05-08: qty 1

## 2021-05-08 MED ORDER — LABETALOL HCL 5 MG/ML IV SOLN: 80.0000 mg | INTRAVENOUS | Status: DC | PRN

## 2021-05-08 MED ORDER — CEFAZOLIN SODIUM-DEXTROSE 2-4 GM/100ML-% IV SOLN
2.0000 g | INTRAVENOUS | Status: DC
  Filled 2021-05-08: qty 100

## 2021-05-08 MED ORDER — HYDRALAZINE HCL 20 MG/ML IJ SOLN: 10.0000 mg | INTRAMUSCULAR | Status: DC | PRN

## 2021-05-08 NOTE — Progress Notes (Addendum)
Melanie Cooley 36 y.o. G3P0020 at [redacted]w[redacted]d HD#13 admitted with decreased FM, severe IUGR, and gHTN   RN called around 6 pm informing that pt's significant other called L&D to ask for a CNM to come assess for 2nd opinion as pt's BPs have been in severe range all day. Per same RN, this AM on call MD reassured pt this AM that her BPs have been in this range and we don't want to drop them too low.  When RN went in the room to assess patient, she informed them to bring up concerns with RN and docs taking care of her rather than calling another unit who is not aware of this pt at all.  Pt was tearful when RN went back to give her an extra Labetalol dose for 140s/some 90s that I noted. She is currently on 100mg  TID Labetalol and I advised to change PM dose to 200mg  but start with 100mg  now and keep PM dose at 100mg  tonight.  BP at that moment was 165/110. I noted that in the chart, so I called RN who was getting ready to sign out and repeat BP in a few minutes allowing patient to stop crying.   I called back and advised floor secretary a few mintues later to have her RN place baby on CEFM amd report her next BP when obtained in few minutes.   Uncertain if this is transient elevation of BP or new increases. Will assess, if persistently elevated, plan to have urgent labs - CBC. CMP, T&S and proceed with C/s   Elveria Royals 05/08/21 7:22 PM

## 2021-05-08 NOTE — Progress Notes (Signed)
Melanie Cooley 36 y.o. G3P0020 at [redacted]w[redacted]d HD#13 admitted with decreased FM, severe IUGR, and gHTN  S: Pt feeling well today. The FM has improved last night per RN but notes chronic decrease. Denies any CTXs, VB, or LOF. No c/o HA, vision changes, CP, SOB, RUQ/epigastric pain. No N/V.  Anxious about slight inc in BPs   O: Vitals:   05/08/21 0102 05/08/21 0553 05/08/21 0832 05/08/21 0844  BP: (!) 134/95 (!) 147/88  (!) 141/98  Pulse: 81 78  88  Resp: 17 17    Temp: 98.2 F (36.8 C) 98.2 F (36.8 C)  98.2 F (36.8 C)  TempSrc: Oral Oral Oral Oral  SpO2: 100% 99%  98%  Weight:      Height:       Physical Exam: -General: AAO, NAD -Heart: regular rate -Lungs: normal effort -Abdomen: gravid uterus -Extremities: no LE edema -Neuro: nonfocal -Skin: intact   Fetal Monitoring: -NST: reactive this morning baseline 135 bpm mod var +accels 15x15's -decels -Toco: acontractile   1/20- BPP 8/8 with inc S/D ratio but no AEDF or reversal  A/P:  Melanie Cooley 36 y.o. G3P0020 at [redacted]w[redacted]d HD#13 admitted with decreased FM, severe IUGR, and gHTN, now clinically stable without evidence of fetal distress  Severe IUGR -Continue fetal surveillance with MFM with UAD twice weekly- with fu growth scan results as noted -NST q shift- reactive last night and this morning -S/p BMZ 12/8-12/9 -S/p NICU consult gHTN -S/p [facility] labs 1/20 WNL, consider repeat weekly or sooner as indicated -Cont home regimen of PO Labetalol 100mg  q8hr and titrate as indicated, Bps well controlled -Cont daily baby ASA 81mg  PEC ppx Anxiety- persists -S/p SW consult -Pt declines pharmacotherapy, consider PP treatment  Continue inpatient management until delivery as previously discussed with MFM. Goal delivery of 36w or sooner if abnormal dopplers or recurrent FHR decelerations. Planning MOD with primary cesarean section 1/27  Patient questions answered at bedside   Melanie Cooley J 05/08/21 9:32 AM

## 2021-05-08 NOTE — Anesthesia Preprocedure Evaluation (Addendum)
Anesthesia Evaluation  Patient identified by MRN, date of birth, ID band Patient awake    Reviewed: Allergy & Precautions, H&P , NPO status , Patient's Chart, lab work & pertinent test results  History of Anesthesia Complications Negative for: history of anesthetic complications  Airway Mallampati: II  TM Distance: >3 FB     Dental   Pulmonary asthma ,    Pulmonary exam normal        Cardiovascular hypertension,  Rhythm:regular Rate:Normal     Neuro/Psych Anxiety Depression Bipolar Disorder negative neurological ROS     GI/Hepatic negative GI ROS, Neg liver ROS,   Endo/Other  negative endocrine ROS  Renal/GU Renal disease (renal tumor s/p nephrectomy; 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)  negative genitourinary   Musculoskeletal   Abdominal   Peds  Hematology negative hematology ROS (+)   Anesthesia Other Findings  Pre-eclampsia with severe features and IUGR  Reproductive/Obstetrics (+) Pregnancy G3P0020 at [redacted]w[redacted]d                             Anesthesia Physical Anesthesia Plan  ASA: 3  Anesthesia Plan: Spinal   Post-op Pain Management:    Induction:   PONV Risk Score and Plan: Ondansetron and Treatment may vary due to age or medical condition  Airway Management Planned:   Additional Equipment:   Intra-op Plan:   Post-operative Plan:   Informed Consent: I have reviewed the patients History and Physical, chart, labs and discussed the procedure including the risks, benefits and alternatives for the proposed anesthesia with the patient or authorized representative who has indicated his/her understanding and acceptance.       Plan Discussed with: Anesthesiologist  Anesthesia Plan Comments:         Anesthesia Quick Evaluation

## 2021-05-08 NOTE — Progress Notes (Addendum)
Melanie Cooley 36 y.o. G3P0020 at [redacted]w[redacted]d HD#13 admitted with decreased FM, severe IUGR, and gHTN   In room with patient since I didn't get update on further BP readings and nothing was charted after 165/110.   En route to hospital, I was checking w/ RN BPs since last report from last shift RN, she reported 169/110 with no communication with MD. She was advised to place baby on CEFM, start IV Labetalol protocol, keep pt NPO and get stat CBC, CMP should we need to proceed with C/s tonight instead of planned for 36 wks on 1/27.  Pt assessed. Reports she was worked up at 6.30 pm after her spouse has called L&D to ask for 2nd opinion from CNM who triaged her re her BPs that were "severe range earlier today" So RN was upset at him and in this pt says she got worked up and was crying and her BP reading were taken then.  Now reports she feels better since I talked with her and HA has resolved. She denies SOB/ CP/ vision changes/ RUQ pain. She desires to have anxiety medicine , something like Xanax to calm her, I cant give her that, esp if we are talking about emerg C/s tonight and she cannot give appropriate consent to anesthesia team. I advised her to take Buspar dose now. She agreed. I had her inform her spouse [name] to come down as we may be considering C/s tonight if her BPs remain in severe range  BP (!) 176/104    Pulse 96    Temp 98.8 F (37.1 C) (Oral)    Resp 18    Ht 5\' 6"  (1.676 m)    Wt 89.3 kg    LMP 09/02/2020    SpO2 99%    BMI 31.76 kg/m  CV RRR  Lungs CTA DTR +3  FHT 135-140/ mod variability/ + accels 15x15/ no decels  No UCs   We finally have IV team here to start her IV line so she can start IV Labetalol protocol.   Informed written consent obtained. OR charge given heads up.   Risks/complications of surgery reviewed incl infection, bleeding, damage to internal organs including bladder, bowels, ureters, blood vessels, other risks from anesthesia, VTE and delayed complications of any  surgery, complications in future surgery reviewed. Also discussed neonatal complications incl difficult delivery, laceration, vacuum assistance, TTN etc. Pt understands and agrees, all concerns addressed.     Melanie Cooley 05/08/21 8:47 PM

## 2021-05-08 NOTE — Progress Notes (Signed)
Pt being assessed frequently by RN and me.  Appears calm, fiance [name] in room, has a lot of questions and all were answered again incl reason to proceed w/ delivery sooner than 1/27 for maternal worsening, now severe range BPs c/w superimposed preeclampsia, reason to likely proceed tonight but await 8 hrs NPO for maternal benefit (aspiration risk) if I can get BP controlled as long as fetal status is reassuring rather than having BPs continue to get worse again and we may have fetal distress situation.  NPO since 8 pm, Spoke with Dr Kerin Perna (anesth) and Dr Barbaraann Rondo ([place]) and all in agreement for 4 am C/s, rather have pt while not in fetal distress. Should anything change - ie cannot control BP even with 2nd agent or fetal status changes, then we proceed sooner than 4 am.   BP (!) 172/102 Comment: post 20mg  labatelol   Pulse 96    Temp 98.8 F (37.1 C) (Oral)    Resp 18    Ht 5\' 6"  (1.676 m)    Wt 89.3 kg    LMP 09/02/2020    SpO2 99%    BMI 31.76 kg/m  BP post 40 mg Labetalol now 141/89   Will recheck BP in 30 min and if still in 140s/ 80-90 range, then repeat q 2 hrs overnight, CEFM and plan C/s at 4 am.

## 2021-05-09 ENCOUNTER — Encounter (HOSPITAL_COMMUNITY)
Admission: AD | Disposition: A | Discharge status: Home / Self Care | Payer: Self-pay | Source: Ambulatory Visit | Attending: Obstetrics and Gynecology

## 2021-05-09 ENCOUNTER — Inpatient Hospital Stay (HOSPITAL_COMMUNITY): Payer: Commercial Managed Care - HMO | Admitting: Anesthesiology

## 2021-05-09 ENCOUNTER — Ambulatory Visit: Payer: Managed Care, Other (non HMO)

## 2021-05-09 ENCOUNTER — Encounter (HOSPITAL_COMMUNITY): Payer: Self-pay | Admitting: Obstetrics and Gynecology

## 2021-05-09 DIAGNOSIS — O99892 Other specified diseases and conditions complicating childbirth: Secondary | ICD-10-CM | POA: Diagnosis present

## 2021-05-09 LAB — CBC
HCT: 29.1 % — ABNORMAL LOW (ref 36.0–46.0)
Hemoglobin: 9.7 g/dL — ABNORMAL LOW (ref 12.0–15.0)
MCH: 29.8 pg (ref 26.0–34.0)
MCHC: 33.3 g/dL (ref 30.0–36.0)
MCV: 89.3 fL (ref 80.0–100.0)
Platelets: 264 10*3/uL (ref 150–400)
RBC: 3.26 MIL/uL — ABNORMAL LOW (ref 3.87–5.11)
RDW: 13.2 % (ref 11.5–15.5)
WBC: 19.3 10*3/uL — ABNORMAL HIGH (ref 4.0–10.5)
nRBC: 0 % (ref 0.0–0.2)

## 2021-05-09 LAB — RPR: RPR Ser Ql: NONREACTIVE

## 2021-05-09 LAB — MAGNESIUM: Magnesium: 3.9 mg/dL — ABNORMAL HIGH (ref 1.7–2.4)

## 2021-05-09 SURGERY — Surgical Case
Anesthesia: Spinal

## 2021-05-09 MED ORDER — DIPHENHYDRAMINE HCL 25 MG PO CAPS: 25.0000 mg | ORAL_CAPSULE | ORAL | Status: DC | PRN

## 2021-05-09 MED ORDER — ACETAMINOPHEN 10 MG/ML IV SOLN
1000.0000 mg | Freq: Once | INTRAVENOUS | Status: DC | PRN
  Administered 2021-05-09: 04:00:00 1000 mg via INTRAVENOUS

## 2021-05-09 MED ORDER — KETOROLAC TROMETHAMINE 30 MG/ML IJ SOLN
30.0000 mg | Freq: Four times a day (QID) | INTRAMUSCULAR | Status: AC
  Administered 2021-05-09 – 2021-05-10 (×4): 30 mg via INTRAVENOUS
  Filled 2021-05-09 (×4): qty 1

## 2021-05-09 MED ORDER — FENTANYL CITRATE (PF) 100 MCG/2ML IJ SOLN
INTRAMUSCULAR | Status: AC
  Filled 2021-05-09: qty 2

## 2021-05-09 MED ORDER — DIPHENHYDRAMINE HCL 25 MG PO CAPS: 25.0000 mg | ORAL_CAPSULE | Freq: Four times a day (QID) | ORAL | Status: DC | PRN

## 2021-05-09 MED ORDER — HYDRALAZINE HCL 50 MG PO TABS
25.0000 mg | ORAL_TABLET | Freq: Once | ORAL | Status: AC
  Administered 2021-05-09: 01:00:00 25 mg via ORAL
  Filled 2021-05-09: qty 1

## 2021-05-09 MED ORDER — DIBUCAINE (PERIANAL) 1 % EX OINT: 1.0000 "application " | TOPICAL_OINTMENT | CUTANEOUS | Status: DC | PRN

## 2021-05-09 MED ORDER — MORPHINE SULFATE (PF) 0.5 MG/ML IJ SOLN
INTRAMUSCULAR | Status: AC
  Filled 2021-05-09: qty 10

## 2021-05-09 MED ORDER — SODIUM CHLORIDE 0.9% FLUSH: 3.0000 mL | INTRAVENOUS | Status: DC | PRN

## 2021-05-09 MED ORDER — HYDRALAZINE HCL 20 MG/ML IJ SOLN: 10.0000 mg | INTRAMUSCULAR | Status: DC | PRN

## 2021-05-09 MED ORDER — HYDROMORPHONE HCL 1 MG/ML IJ SOLN: 0.2500 mg | INTRAMUSCULAR | Status: DC | PRN

## 2021-05-09 MED ORDER — MORPHINE SULFATE (PF) 0.5 MG/ML IJ SOLN
INTRAMUSCULAR | Status: DC | PRN
  Administered 2021-05-09: .15 mg via INTRATHECAL

## 2021-05-09 MED ORDER — ZOLPIDEM TARTRATE 5 MG PO TABS: 5.0000 mg | ORAL_TABLET | Freq: Every evening | ORAL | Status: DC | PRN

## 2021-05-09 MED ORDER — NALOXONE HCL 4 MG/10ML IJ SOLN
1.0000 ug/kg/h | INTRAVENOUS | Status: DC | PRN
  Filled 2021-05-09: qty 5

## 2021-05-09 MED ORDER — MENTHOL 3 MG MT LOZG
1.0000 | LOZENGE | OROMUCOSAL | Status: DC | PRN
Start: 2021-05-09 — End: 2021-05-11

## 2021-05-09 MED ORDER — DEXMEDETOMIDINE (PRECEDEX) IN NS 20 MCG/5ML (4 MCG/ML) IV SYRINGE
PREFILLED_SYRINGE | INTRAVENOUS | Status: DC | PRN
  Administered 2021-05-09 (×2): 4 ug via INTRAVENOUS
  Administered 2021-05-09: 8 ug via INTRAVENOUS

## 2021-05-09 MED ORDER — MAGNESIUM SULFATE 40 GM/1000ML IV SOLN
INTRAVENOUS | Status: AC
  Administered 2021-05-09: 06:00:00 2 g/h via INTRAVENOUS
  Filled 2021-05-09: qty 1000

## 2021-05-09 MED ORDER — OXYTOCIN-SODIUM CHLORIDE 30-0.9 UT/500ML-% IV SOLN
INTRAVENOUS | Status: AC
  Filled 2021-05-09: qty 500

## 2021-05-09 MED ORDER — ONDANSETRON HCL 4 MG/2ML IJ SOLN
4.0000 mg | Freq: Three times a day (TID) | INTRAMUSCULAR | Status: DC | PRN
  Administered 2021-05-09 – 2021-05-10 (×2): 4 mg via INTRAVENOUS
  Filled 2021-05-09 (×3): qty 2

## 2021-05-09 MED ORDER — BUPIVACAINE IN DEXTROSE 0.75-8.25 % IT SOLN
INTRATHECAL | Status: DC | PRN
  Administered 2021-05-09: 1.6 mL via INTRATHECAL

## 2021-05-09 MED ORDER — MEPERIDINE HCL 25 MG/ML IJ SOLN
INTRAMUSCULAR | Status: DC | PRN
  Administered 2021-05-09: 6.25 mg via INTRAVENOUS

## 2021-05-09 MED ORDER — LACTATED RINGERS IV SOLN: INTRAVENOUS | Status: DC

## 2021-05-09 MED ORDER — MAGNESIUM SULFATE 40 GM/1000ML IV SOLN
2.0000 g/h | INTRAVENOUS | Status: AC
  Administered 2021-05-09: 2 g/h via INTRAVENOUS
  Filled 2021-05-09: qty 1000

## 2021-05-09 MED ORDER — SENNOSIDES-DOCUSATE SODIUM 8.6-50 MG PO TABS
2.0000 | ORAL_TABLET | Freq: Every day | ORAL | Status: DC
  Administered 2021-05-10 – 2021-05-11 (×2): 2 via ORAL
  Filled 2021-05-09 (×2): qty 2

## 2021-05-09 MED ORDER — NALOXONE HCL 0.4 MG/ML IJ SOLN: 0.4000 mg | INTRAMUSCULAR | Status: DC | PRN

## 2021-05-09 MED ORDER — OXYCODONE HCL 5 MG PO TABS
5.0000 mg | ORAL_TABLET | Freq: Four times a day (QID) | ORAL | Status: DC | PRN
  Administered 2021-05-10 – 2021-05-11 (×5): 5 mg via ORAL
  Filled 2021-05-09 (×6): qty 1

## 2021-05-09 MED ORDER — NIFEDIPINE ER OSMOTIC RELEASE 30 MG PO TB24
30.0000 mg | ORAL_TABLET | Freq: Every day | ORAL | Status: DC
  Administered 2021-05-09 – 2021-05-11 (×3): 30 mg via ORAL
  Filled 2021-05-09 (×3): qty 1

## 2021-05-09 MED ORDER — MAGNESIUM SULFATE BOLUS VIA INFUSION
4.0000 g | Freq: Once | INTRAVENOUS | Status: AC
Start: 2021-05-09 — End: 2021-05-09
  Administered 2021-05-09: 06:00:00 4 g via INTRAVENOUS
  Filled 2021-05-09: qty 1000

## 2021-05-09 MED ORDER — TETANUS-DIPHTH-ACELL PERTUSSIS 5-2.5-18.5 LF-MCG/0.5 IM SUSY
0.5000 mL | PREFILLED_SYRINGE | Freq: Once | INTRAMUSCULAR | Status: AC
  Administered 2021-05-11: 0.5 mL via INTRAMUSCULAR
  Filled 2021-05-09: qty 0.5

## 2021-05-09 MED ORDER — CEFAZOLIN SODIUM-DEXTROSE 2-3 GM-%(50ML) IV SOLR
INTRAVENOUS | Status: DC | PRN
  Administered 2021-05-09: 2 g via INTRAVENOUS

## 2021-05-09 MED ORDER — OXYCODONE HCL 5 MG PO TABS: 5.0000 mg | ORAL_TABLET | Freq: Once | ORAL | Status: DC | PRN

## 2021-05-09 MED ORDER — MEPERIDINE HCL 25 MG/ML IJ SOLN: 6.2500 mg | INTRAMUSCULAR | Status: DC | PRN

## 2021-05-09 MED ORDER — OXYCODONE HCL 5 MG/5ML PO SOLN: 5.0000 mg | Freq: Once | ORAL | Status: DC | PRN

## 2021-05-09 MED ORDER — FENTANYL CITRATE (PF) 100 MCG/2ML IJ SOLN
INTRAMUSCULAR | Status: DC | PRN
  Administered 2021-05-09: 15 ug via INTRATHECAL

## 2021-05-09 MED ORDER — ACETAMINOPHEN 10 MG/ML IV SOLN
INTRAVENOUS | Status: AC
  Filled 2021-05-09: qty 100

## 2021-05-09 MED ORDER — SCOPOLAMINE 1 MG/3DAYS TD PT72
1.0000 | MEDICATED_PATCH | Freq: Once | TRANSDERMAL | Status: DC
  Administered 2021-05-09: 05:00:00 1.5 mg via TRANSDERMAL
  Filled 2021-05-09: qty 1

## 2021-05-09 MED ORDER — ALPRAZOLAM 0.5 MG PO TABS: 0.5000 mg | ORAL_TABLET | Freq: Two times a day (BID) | ORAL | Status: DC | PRN

## 2021-05-09 MED ORDER — KETOROLAC TROMETHAMINE 30 MG/ML IJ SOLN
30.0000 mg | Freq: Four times a day (QID) | INTRAMUSCULAR | Status: AC | PRN
  Administered 2021-05-09: 05:00:00 30 mg via INTRAMUSCULAR

## 2021-05-09 MED ORDER — COCONUT OIL OIL
1.0000 "application " | TOPICAL_OIL | Status: DC | PRN
  Administered 2021-05-09: 1 via TOPICAL

## 2021-05-09 MED ORDER — BUSPIRONE HCL 15 MG PO TABS
7.5000 mg | ORAL_TABLET | Freq: Two times a day (BID) | ORAL | Status: DC
  Filled 2021-05-09 (×6): qty 1

## 2021-05-09 MED ORDER — LABETALOL HCL 5 MG/ML IV SOLN: 20.0000 mg | INTRAVENOUS | Status: DC | PRN

## 2021-05-09 MED ORDER — POLYETHYLENE GLYCOL 3350 17 G PO PACK
17.0000 g | PACK | Freq: Every day | ORAL | Status: DC
  Administered 2021-05-09 – 2021-05-11 (×4): 17 g via ORAL
  Filled 2021-05-09 (×3): qty 1

## 2021-05-09 MED ORDER — KETOROLAC TROMETHAMINE 30 MG/ML IJ SOLN: 30.0000 mg | Freq: Four times a day (QID) | INTRAMUSCULAR | Status: AC | PRN

## 2021-05-09 MED ORDER — PRENATAL MULTIVITAMIN CH
1.0000 | ORAL_TABLET | Freq: Every day | ORAL | Status: DC
  Filled 2021-05-09: qty 1

## 2021-05-09 MED ORDER — DIPHENHYDRAMINE HCL 50 MG/ML IJ SOLN: 12.5000 mg | INTRAMUSCULAR | Status: DC | PRN

## 2021-05-09 MED ORDER — OXYTOCIN-SODIUM CHLORIDE 30-0.9 UT/500ML-% IV SOLN: 2.5000 [IU]/h | INTRAVENOUS | Status: AC

## 2021-05-09 MED ORDER — ACETAMINOPHEN 325 MG PO TABS
650.0000 mg | ORAL_TABLET | ORAL | Status: DC | PRN
  Administered 2021-05-09 – 2021-05-11 (×6): 650 mg via ORAL
  Filled 2021-05-09 (×6): qty 2

## 2021-05-09 MED ORDER — LABETALOL HCL 5 MG/ML IV SOLN: 40.0000 mg | INTRAVENOUS | Status: DC | PRN

## 2021-05-09 MED ORDER — SIMETHICONE 80 MG PO CHEW
80.0000 mg | CHEWABLE_TABLET | Freq: Three times a day (TID) | ORAL | Status: DC
  Administered 2021-05-09 – 2021-05-11 (×7): 80 mg via ORAL
  Filled 2021-05-09 (×8): qty 1

## 2021-05-09 MED ORDER — SCOPOLAMINE 1 MG/3DAYS TD PT72
MEDICATED_PATCH | TRANSDERMAL | Status: AC
  Filled 2021-05-09: qty 1

## 2021-05-09 MED ORDER — ONDANSETRON HCL 4 MG/2ML IJ SOLN
INTRAMUSCULAR | Status: DC | PRN
  Administered 2021-05-09: 4 mg via INTRAVENOUS

## 2021-05-09 MED ORDER — SIMETHICONE 80 MG PO CHEW
80.0000 mg | CHEWABLE_TABLET | ORAL | Status: DC | PRN
  Administered 2021-05-09 – 2021-05-10 (×2): 80 mg via ORAL
  Filled 2021-05-09 (×2): qty 1

## 2021-05-09 MED ORDER — MEPERIDINE HCL 25 MG/ML IJ SOLN
INTRAMUSCULAR | Status: AC
  Filled 2021-05-09: qty 1

## 2021-05-09 MED ORDER — OXYTOCIN-SODIUM CHLORIDE 30-0.9 UT/500ML-% IV SOLN
INTRAVENOUS | Status: DC | PRN
  Administered 2021-05-09: 250 mL via INTRAVENOUS

## 2021-05-09 MED ORDER — PHENYLEPHRINE HCL-NACL 20-0.9 MG/250ML-% IV SOLN
INTRAVENOUS | Status: AC
  Filled 2021-05-09: qty 250

## 2021-05-09 MED ORDER — IBUPROFEN 600 MG PO TABS
600.0000 mg | ORAL_TABLET | Freq: Four times a day (QID) | ORAL | Status: DC
  Administered 2021-05-10 – 2021-05-11 (×5): 600 mg via ORAL
  Filled 2021-05-09 (×5): qty 1

## 2021-05-09 MED ORDER — PHENYLEPHRINE 40 MCG/ML (10ML) SYRINGE FOR IV PUSH (FOR BLOOD PRESSURE SUPPORT)
PREFILLED_SYRINGE | INTRAVENOUS | Status: AC
  Filled 2021-05-09: qty 10

## 2021-05-09 MED ORDER — PHENYLEPHRINE HCL-NACL 20-0.9 MG/250ML-% IV SOLN
INTRAVENOUS | Status: DC | PRN
  Administered 2021-05-09: 10 ug/min via INTRAVENOUS

## 2021-05-09 MED ORDER — SOD CITRATE-CITRIC ACID 500-334 MG/5ML PO SOLN
ORAL | Status: AC
  Filled 2021-05-09: qty 30

## 2021-05-09 MED ORDER — LABETALOL HCL 5 MG/ML IV SOLN: 80.0000 mg | INTRAVENOUS | Status: DC | PRN

## 2021-05-09 MED ORDER — KETOROLAC TROMETHAMINE 30 MG/ML IJ SOLN
INTRAMUSCULAR | Status: AC
  Filled 2021-05-09: qty 1

## 2021-05-09 MED ORDER — WITCH HAZEL-GLYCERIN EX PADS: 1.0000 "application " | MEDICATED_PAD | CUTANEOUS | Status: DC | PRN

## 2021-05-09 SURGICAL SUPPLY — 33 items
APL SKNCLS STERI-STRIP NONHPOA (GAUZE/BANDAGES/DRESSINGS) ×1
BENZOIN TINCTURE PRP APPL 2/3 (GAUZE/BANDAGES/DRESSINGS) ×1 IMPLANT
CHLORAPREP W/TINT 26ML (MISCELLANEOUS) ×2 IMPLANT
CLAMP CORD UMBIL (MISCELLANEOUS) IMPLANT
CLOSURE STERI STRIP 1/2 X4 (GAUZE/BANDAGES/DRESSINGS) ×1 IMPLANT
CLOTH BEACON ORANGE TIMEOUT ST (SAFETY) ×2 IMPLANT
DRSG OPSITE POSTOP 4X10 (GAUZE/BANDAGES/DRESSINGS) ×2 IMPLANT
ELECT REM PT RETURN 9FT ADLT (ELECTROSURGICAL) ×2
ELECTRODE REM PT RTRN 9FT ADLT (ELECTROSURGICAL) ×1 IMPLANT
EXTRACTOR VACUUM KIWI (MISCELLANEOUS) IMPLANT
GLOVE BIOGEL PI IND STRL 7.0 (GLOVE) ×3 IMPLANT
GLOVE BIOGEL PI INDICATOR 7.0 (GLOVE) ×3
GLOVE ECLIPSE 6.5 STRL STRAW (GLOVE) ×2 IMPLANT
GOWN STRL REUS W/TWL LRG LVL3 (GOWN DISPOSABLE) ×4 IMPLANT
KIT ABG SYR 3ML LUER SLIP (SYRINGE) IMPLANT
LIGASURE IMPACT 36 18CM CVD LR (INSTRUMENTS) ×2 IMPLANT
NDL HYPO 25X5/8 SAFETYGLIDE (NEEDLE) IMPLANT
NEEDLE HYPO 25X5/8 SAFETYGLIDE (NEEDLE) IMPLANT
NS IRRIG 1000ML POUR BTL (IV SOLUTION) ×2 IMPLANT
PACK C SECTION WH (CUSTOM PROCEDURE TRAY) ×2 IMPLANT
PAD OB MATERNITY 4.3X12.25 (PERSONAL CARE ITEMS) ×2 IMPLANT
STRIP CLOSURE SKIN 1/2X4 (GAUZE/BANDAGES/DRESSINGS) IMPLANT
SUT MNCRL 0 VIOLET CTX 36 (SUTURE) ×2 IMPLANT
SUT MONOCRYL 0 CTX 36 (SUTURE) ×2
SUT PLAIN 0 NONE (SUTURE) IMPLANT
SUT PLAIN 2 0 (SUTURE) ×4
SUT PLAIN ABS 2-0 CT1 27XMFL (SUTURE) ×1 IMPLANT
SUT VIC AB 0 CT1 27 (SUTURE) ×2
SUT VIC AB 0 CT1 27XBRD ANBCTR (SUTURE) ×1 IMPLANT
SUT VIC AB 4-0 KS 27 (SUTURE) ×2 IMPLANT
TOWEL OR 17X24 6PK STRL BLUE (TOWEL DISPOSABLE) ×2 IMPLANT
TRAY FOLEY W/BAG SLVR 14FR LF (SET/KITS/TRAYS/PACK) IMPLANT
WATER STERILE IRR 1000ML POUR (IV SOLUTION) ×3 IMPLANT

## 2021-05-09 NOTE — Anesthesia Postprocedure Evaluation (Signed)
Anesthesia Post Note  Patient: Melanie Cooley  Procedure(s) Performed: Primary CESAREAN SECTION     Patient location during evaluation: PACU Anesthesia Type: Spinal Level of consciousness: oriented and awake and alert Pain management: pain level controlled Vital Signs Assessment: post-procedure vital signs reviewed and stable Respiratory status: spontaneous breathing, respiratory function stable and nonlabored ventilation Cardiovascular status: blood pressure returned to baseline and stable Postop Assessment: no headache, no backache, no apparent nausea or vomiting and spinal receding Anesthetic complications: no   No notable events documented.  Last Vitals:  Vitals:   05/09/21 0150 05/09/21 0330  BP: (!) 162/97 (!) 147/94  Pulse: 99 88  Resp:  18  Temp:  (!) 36.3 C  SpO2:  96%    Last Pain:  Vitals:   05/09/21 0330  TempSrc:   PainSc: 0-No pain   Pain Goal: Patients Stated Pain Goal: 1 (05/05/21 2345)              Epidural/Spinal Function Cutaneous sensation: Tingles (05/09/21 0330), Patient able to flex knees: No (05/09/21 0330), Patient able to lift hips off bed: No (05/09/21 0330), Back pain beyond tenderness at insertion site: No (05/09/21 0330), Progressively worsening motor and/or sensory loss: No (05/09/21 0330), Bowel and/or bladder incontinence post epidural: No (05/09/21 0330)  Lidia Collum

## 2021-05-09 NOTE — Progress Notes (Signed)
Attempted the third time post op to get pt to ambulate. Pt was able to sit at side of bed then stand infront of bed. She complained of nausea and being dizzy. Rn helped pt back to bed. We will try again in the morning.

## 2021-05-09 NOTE — Anesthesia Procedure Notes (Signed)
Spinal  Patient location during procedure: OR Reason for block: surgical anesthesia Staffing Performed: anesthesiologist  Anesthesiologist: Lidia Collum, MD Preanesthetic Checklist Completed: patient identified, IV checked, risks and benefits discussed, surgical consent, monitors and equipment checked, pre-op evaluation and timeout performed Spinal Block Patient position: sitting Prep: DuraPrep and site prepped and draped Patient monitoring: continuous pulse ox, blood pressure and heart rate Approach: midline Location: L3-4 Injection technique: single-shot Needle Needle type: Pencan  Needle gauge: 24 G Needle length: 10 cm Assessment Events: CSF return Additional Notes Functioning IV was confirmed and monitors were applied. Sterile prep and drape, including hand hygiene and sterile gloves were used. The patient was positioned and the spine was prepped. The skin was anesthetized with lidocaine.  Free flow of clear CSF was obtained prior to injecting local anesthetic into the CSF. The needle was carefully withdrawn. The patient tolerated the procedure well.

## 2021-05-09 NOTE — Progress Notes (Signed)
Chaplain attempted follow up with pt after learning she had delivered her baby. Chaplain encountered pt's fiance in the hallway. He reports she is trying to get some sleep. Chaplain asked open ended questions to facilitate emotional expression and story telling. He shared about Melanie Cooley's rapid change and an upsetting experience earlier in the day when she felt her concerns regarding her blood pressure were dismissed. He reports Melanie Cooley was scolded for seeking a second opinion, which resulted in her feeling tearful and worried. Chaplain acknowledged Melanie Cooley's history of having medical concerns which she felt were minimized to later become significant concerns and the increasing trauma which she has felt as a result. FOB reports that Melanie Cooley is doing well and shared proud photos.  Please page as further needs arise.  Melanie Cooley. Melanie Cooley, M.Div. [facility] Chaplain Pager [phone] Office [phone]

## 2021-05-09 NOTE — Op Note (Signed)
Cesarean Section Procedure Note   Melanie Cooley  05/09/2021 Procedure: Primary Low Transverse Cesarean section  Indications:  35.4 weeks, Severe range BPs. Gestation HTN, worsening BPs, fetal deceleration, severe IUGR     Pre-operative Diagnosis: primary cesarean section; severe range hypertension; severe iugr at 35.4 wks with fetal decelerations.   Post-operative Diagnosis: Same   Surgeon: Azucena Fallen, MD    Assistants: none  Anesthesia: spinal   Procedure Details:  The patient was seen in the Antepartum Room. The risks, benefits, complications, treatment options, and expected outcomes were discussed with the patient. The patient concurred with the proposed plan, giving informed consent. identified as Melanie Cooley and the procedure verified as C-Section Delivery.  In the OR, FHT noted in 130s. A Time Out was held and the above information confirmed. 2 gm Ancef given. After induction of anesthesia, the patient was draped and prepped in the usual sterile manner, foley was draining urine well.  A pfannenstiel incision was made and carried down through the subcutaneous tissue to the fascia. Fascial incision was made and extended transversely. The fascia was separated from the underlying rectus tissue superiorly and inferiorly. The peritoneum was identified and entered. Peritoneal incision was extended longitudinally. Alexis-O retractor placed. The utero-vesical peritoneal reflection was incised transversely and the bladder flap was bluntly freed from the lower uterine segment. A low transverse uterine incision was made. Clear amniotic fluid drained. Delivered from cephalic presentation was a FEMALE infant with vigorous cry. Apgar scores of 9 at one minute and 10 at five minutes. Delayed cord clamping done at 1 minute and baby handed to NICU team in attendance. Cord ph was not sent. Cord blood was obtained for evaluation. The placenta was removed Intact and appeared normal and sent to pathology.  The uterine outline, tubes and ovaries appeared normal}. The uterine incision was closed with running locked sutures of 0Monocryl. A second imbricating layer sutured.   Hemostasis was observed. Alexis retractor removed. Peritoneal closure done with 2-0 Vicryl.  The fascia was then reapproximated with running sutures of 0Vicryl. The subcuticular closure was performed using 2-0plain gut. The skin was closed with 4-0Vicryl. Steristrips, honeycomb dressing placed.  Instrument, sponge, and needle counts were correct prior the abdominal closure and were correct at the conclusion of the case.   Findings: Female infant delivered via [facility] hysterotomy at 2.40 AM on 05/09/21. Vigorous cry. Apgars 9 and 10 per NICU, weight 3 lb 9 oz. Placenta, cord normal, sent to path.    Estimated Blood Loss: 295 mL   Total IV Fluids: 1000 ml LR   Urine Output:  200CC OF clear urine  Specimens: Cord blood and placenta    Complications: no complications  Disposition: PACU - hemodynamically stable.   Maternal Condition: stable   Baby condition / location:  NICU  Attending Attestation: I performed the procedure.   Signed: Surgeon(s): Azucena Fallen, MD

## 2021-05-09 NOTE — Progress Notes (Signed)
Attempted to get pt up out of bed a second time. Pt was able to sit up on the side of bed with relative ease, but upon standing, became dizzy and lightheaded. Pt laid back down on the bed and was repositioned for comfort.

## 2021-05-09 NOTE — Transfer of Care (Signed)
Immediate Anesthesia Transfer of Care Note  Patient: Melanie Cooley  Procedure(s) Performed: Primary CESAREAN SECTION  Patient Location: PACU  Anesthesia Type:Spinal  Level of Consciousness: awake, alert  and oriented  Airway & Oxygen Therapy: Patient Spontanous Breathing  Post-op Assessment: Report given to RN and Post -op Vital signs reviewed and stable  Post vital signs: Reviewed and stable  Last Vitals:  Vitals Value Taken Time  BP 147/94 05/09/21 0330  Temp    Pulse 87 05/09/21 0337  Resp 16 05/09/21 0337  SpO2 98 % 05/09/21 0337  Vitals shown include unvalidated device data.  Last Pain:  Vitals:   05/08/21 2118  TempSrc:   PainSc: 0-No pain      Patients Stated Pain Goal: 1 (32/54/98 2641)  Complications: No notable events documented.

## 2021-05-09 NOTE — Progress Notes (Signed)
Attempted to get pt up out of bed. Upon standing, pt started feeling nauseous, faint, lightheaded and reported feeling "heavy legs." Pt sat down on the side of the bed and requested to lay back down. Vital signs were stable and postpartum assessment WNL. Pt will try again later when she is feeling better.

## 2021-05-09 NOTE — Lactation Note (Signed)
This note was copied from a baby's chart. Lactation Consultation Note  Patient Name: Boy Jessa Stinson VOHKG'O Date: 05/09/2021 Reason for consult: Initial assessment;Late-preterm 34-36.6wks;NICU baby;1st time breastfeeding;Primapara;Infant < 6lbs;Other (Comment) (AMA) Age:38 hours  Visited with mom of 15 hours old LPI NICU female, she's a P1 and reported (+) breast changes during the pregnancy. She's already pumping and reports it's going well. [place] and [place] assisted mom with breast massage, she was able to get drops of colostrum, praised her for her efforts.  Reviewed pumping schedule, pumping log, expectations, lactogenesis II and benefits of premature. This LC also requested coconut oil for mom, she's also using a nipple butter. Mom was on Mag and she'll need reinforcement on her education.  Maternal Data Has patient been taught Hand Expression?: Yes Does the patient have breastfeeding experience prior to this delivery?: No  Feeding Mother's Current Feeding Choice: Breast Milk and Donor Milk  Lactation Tools Discussed/Used Tools: Pump;Flanges Flange Size: 24 Breast pump type: Double-Electric Breast Pump Pump Education: Setup, frequency, and cleaning;Milk Storage Reason for Pumping: LPI in NICU Pumping frequency: q 3 hours (recommended) Pumped volume:  (droplets)  Interventions Interventions: Breast feeding basics reviewed;DEBP;Education;"The NICU and Your Baby" book;LC Services brochure  Plan of care  Encouraged mom to continue pumping every 3 hours, at least 8 times/24 hours Breast massage, hand expression and coconut oil were also encouraged  GOB (maternal) present and very supportive. All questions and concerns answered, family to call NICU LC PRN.  Discharge Pump: DEBP;Personal (Motif [name])  Consult Status Consult Status: Follow-up Date: 05/09/21 Follow-up type: In-patient   Korayma Hagwood Francene Boyers 05/09/2021, 12:25 PM

## 2021-05-09 NOTE — Progress Notes (Signed)
Came to see pt due to recurrent severe range BPs. RN advised PO Hydralazine 25 mg and IV Labetalol 20mg  Arrived to room while baby in decel at 1.36 am, recovered at 4 minutes and now in cat I with baseline 130s mod variab, no accels noted since but no decels.  BP after IV Labetalol 20mg  - BP (!) 162/97    Pulse 99    Temp 98.8 F (37.1 C) (Oral)    Resp 18    Ht 5\' 6"  (1.676 m)    Wt 89.3 kg    LMP 09/02/2020    SpO2 99%    BMI 31.76 kg/m   OR informed to prep for surgery, proceeding with emerg C/s now and not wait till 4 am due to now recurrent severe range BPs and recent fetal deceleration.  Pt informed and agrees.   --Criss Alvine Ysabel Stankovich MD

## 2021-05-10 LAB — SURGICAL PATHOLOGY

## 2021-05-10 MED ORDER — PRENATAL MULTIVITAMIN CH
1.0000 | ORAL_TABLET | Freq: Every day | ORAL | Status: DC
  Administered 2021-05-10: 12:00:00 1 via ORAL

## 2021-05-10 NOTE — Lactation Note (Signed)
This note was copied from a baby's chart. Lactation Consultation Note [place] paged to NICU patient's room. Mother was sts with sleeping baby. She had f/u questions regarding attempting bf'ing if/when baby is cuing. We further discussed IDF algorithm and feeding on schedule. All questions were answered.    Patient Name: Boy Valeen Borys XJOIT'G Date: 05/10/2021 Reason for consult: Follow-up assessment Age:38 hours  Feeding Mother's Current Feeding Choice: Breast Milk and Donor Milk   Lactation Tools Discussed/Used Pumping frequency: q3  Interventions Interventions: Breast feeding basics reviewed;Education;Tour manager education  Consult Status Consult Status: Follow-up Date: 05/10/21 Follow-up type: In-patient   [name] 05/10/2021, 11:03 AM

## 2021-05-10 NOTE — Progress Notes (Signed)
POD#1  S: Pt notes pain controlled w/ po meds, minimal lochia, no void yet as patient just with Foley removed, out of bed w/o dizziness or chest pain, tol reg po, + flatus.  Eating normally.  Baby in NICU.  No headache, no right upper quadrant pain  Vitals:   05/10/21 0403 05/10/21 0620 05/10/21 0809 05/10/21 1140  BP: 131/74  133/88 134/76  Pulse: (!) 117  (!) 103 97  Resp: 18  18 18   Temp: 98 F (36.7 C)  98.2 F (36.8 C) 98.2 F (36.8 C)  TempSrc: Oral  Oral Oral  SpO2:   99% 99%  Weight:  84.6 kg    Height:        Gen: well appearing Abd: soft, ND, approp tender, fundus below umbilicus, NT Inc: C/D/I, LE: tr edema, NT  CBC    Component Value Date/Time   WBC 19.3 (H) 05/09/2021 1030   RBC 3.26 (L) 05/09/2021 1030   HGB 9.7 (L) 05/09/2021 1030   HCT 29.1 (L) 05/09/2021 1030   PLT 264 05/09/2021 1030   MCV 89.3 05/09/2021 1030   MCH 29.8 05/09/2021 1030   MCHC 33.3 05/09/2021 1030   RDW 13.2 05/09/2021 1030   LYMPHSABS 1.5 05/05/2021 0520   MONOABS 1.2 (H) 05/05/2021 0520   EOSABS 0.2 05/05/2021 0520   BASOSABS 0.0 05/05/2021 0520    A/P: POD#  1 s/p primary cesarean section for severe preeclampsia given severe range blood pressures and severe IUGR. -Preeclampsia, hypertension.  Magnesium just stopped.  Blood pressure stable on once daily and Procardia.  Continue to follow -Anemia.  Iron at discharge.  History of constipation add stool softeners -Baby in NICU - post-op. Doing well.  Awaiting return of bowel function. -Anxiety.  BuSpar as needed  Ala Dach 05/10/2021 12:41 PM

## 2021-05-10 NOTE — Lactation Note (Signed)
This note was copied from a baby's chart.  NICU Lactation Consultation Note  Patient Name: Melanie Cooley PXTGG'[id number] Date: 05/10/2021 Age:38 hours   Subjective Reason for consult: Follow-up assessment Mother is pumping frequently. We reviewed IDF. Mother is aware of [place] support for bf'ing when baby is ready.   Objective Infant data: Mother's Current Feeding Choice: Breast Milk and Donor Milk  Infant feeding assessment Scale for Readiness: 3 Scale for Quality: 2  Maternal data: [id number]  C-Section, Low Transverse Does the patient have breastfeeding experience prior to this delivery?: No  Pumping frequency: q3 Pumped volume: 0 mL (droplets) Flange Size: 24   Pump: DEBP, Personal (Motif [place])  Assessment Maternal: Milk volume: Normal   Intervention/Plan Interventions: Breast feeding basics reviewed; Education; Infant Driven Feeding Algorithm education  Tools: Pump; Flanges Pump Education: Setup, frequency, and cleaning; Milk Storage  Plan: Consult Status: Follow-up  NICU Follow-up type: Verify absence of engorgement; Verify onset of copious milk; Assist with IDF-2 (Mother does not need to pre-pump before breastfeeding)  Mother will continue pumping q3 and bring any EBM to NICU.   [name] 05/10/2021, 11:02 AM

## 2021-05-11 ENCOUNTER — Encounter (HOSPITAL_COMMUNITY): Payer: Self-pay | Admitting: Obstetrics and Gynecology

## 2021-05-11 ENCOUNTER — Inpatient Hospital Stay (HOSPITAL_COMMUNITY)
Admission: AD | Admit: 2021-05-11 | Discharge: 2021-05-12 | Disposition: A | Discharge status: Home / Self Care | Payer: Commercial Managed Care - HMO | Attending: Obstetrics and Gynecology | Admitting: Obstetrics and Gynecology

## 2021-05-11 ENCOUNTER — Other Ambulatory Visit: Payer: Self-pay

## 2021-05-11 DIAGNOSIS — N189 Chronic kidney disease, unspecified: Secondary | ICD-10-CM | POA: Insufficient documentation

## 2021-05-11 DIAGNOSIS — O165 Unspecified maternal hypertension, complicating the puerperium: Secondary | ICD-10-CM | POA: Diagnosis not present

## 2021-05-11 DIAGNOSIS — I129 Hypertensive chronic kidney disease with stage 1 through stage 4 chronic kidney disease, or unspecified chronic kidney disease: Secondary | ICD-10-CM | POA: Insufficient documentation

## 2021-05-11 DIAGNOSIS — R519 Headache, unspecified: Secondary | ICD-10-CM | POA: Insufficient documentation

## 2021-05-11 LAB — COMPREHENSIVE METABOLIC PANEL
ALT: 29 U/L (ref 0–44)
AST: 40 U/L (ref 15–41)
Albumin: 2.8 g/dL — ABNORMAL LOW (ref 3.5–5.0)
Alkaline Phosphatase: 87 U/L (ref 38–126)
Anion gap: 13 (ref 5–15)
BUN: 10 mg/dL (ref 6–20)
CO2: 23 mmol/L (ref 22–32)
Calcium: 8.9 mg/dL (ref 8.9–10.3)
Chloride: 102 mmol/L (ref 98–111)
Creatinine, Ser: 0.6 mg/dL (ref 0.44–1.00)
GFR, Estimated: >60 mL/min (ref >60)
Glucose, Bld: 97 mg/dL (ref 70–99)
Potassium: 4.1 mmol/L (ref 3.5–5.1)
Sodium: 138 mmol/L (ref 135–145)
Total Bilirubin: 0.2 mg/dL — ABNORMAL LOW (ref 0.3–1.2)
Total Protein: 6.4 g/dL — ABNORMAL LOW (ref 6.5–8.1)

## 2021-05-11 LAB — CBC
HCT: 27.7 % — ABNORMAL LOW (ref 36.0–46.0)
Hemoglobin: 9.3 g/dL — ABNORMAL LOW (ref 12.0–15.0)
MCH: 30.2 pg (ref 26.0–34.0)
MCHC: 33.6 g/dL (ref 30.0–36.0)
MCV: 89.9 fL (ref 80.0–100.0)
Platelets: 341 10*3/uL (ref 150–400)
RBC: 3.08 MIL/uL — ABNORMAL LOW (ref 3.87–5.11)
RDW: 13.7 % (ref 11.5–15.5)
WBC: 13.3 10*3/uL — ABNORMAL HIGH (ref 4.0–10.5)
nRBC: 0 % (ref 0.0–0.2)

## 2021-05-11 MED ORDER — IBUPROFEN 600 MG PO TABS: 600.0000 mg | ORAL_TABLET | Freq: Four times a day (QID) | ORAL | 0 refills | Status: DC

## 2021-05-11 MED ORDER — OXYCODONE HCL 5 MG PO TABS: 5.0000 mg | ORAL_TABLET | Freq: Four times a day (QID) | ORAL | 0 refills | Status: DC | PRN

## 2021-05-11 MED ORDER — NIFEDIPINE ER 30 MG PO TB24: 30.0000 mg | ORAL_TABLET | Freq: Every day | ORAL | 0 refills | Status: DC

## 2021-05-11 MED ORDER — FERROUS SULFATE 324 MG PO TBEC: 324.0000 mg | DELAYED_RELEASE_TABLET | Freq: Every day | ORAL | 3 refills | Status: DC

## 2021-05-11 NOTE — Lactation Note (Addendum)
This note was copied from a baby's chart. Lactation Consultation Note  Patient Name: Melanie Cooley TWSFK'C Date: 05/11/2021 Reason for consult: Follow-up assessment;Primapara;1st time breastfeeding;NICU baby;Late-preterm 34-36.6wks Age:38 hours  Lactation and lactation student entered Ms. Swenson's room (on NICU) and assisted with transitioning from STS to latch. We practiced hand expression first and noted the beginning stages of lactogenesis II. Baby was quiet alert. I showed Ms. Heater how to hand express her milk onto baby's lips. He began to lick and then open to latch. I assisted with latching baby to the right breast in cross cradle hold. Baby latched two times for about 3 minutes each with some strong active suckling bursts followed by periods of rest. After the second latch, he became sleepy. We discussed feeding cues and signs that baby needs to take a break or stop. She held him wile he received his supplementary feeding via gavage.  I educated on LPI behaviors, latch and positioning technique, and encouraged her to post pump. She has her personal pump with her, but I educated on the benefits of using the Symphony pump while in the hospital. Her support person was her mother at this visit. My lactation student, Gordy Clement was also present. Latch 6 score is conservative. We noted some good rhythmic suckling sequences.  Maternal Data Has patient been taught Hand Expression?: Yes Does the patient have breastfeeding experience prior to this delivery?: No  Feeding Mother's Current Feeding Choice: Breast Milk and Donor Milk  LATCH Score Latch: Repeated attempts needed to sustain latch, nipple held in mouth throughout feeding, stimulation needed to elicit sucking reflex.  Audible Swallowing: None  Type of Nipple: Everted at rest and after stimulation  Comfort (Breast/Nipple): Soft / non-tender  Hold (Positioning): Assistance needed to correctly position infant at breast and maintain  latch.  LATCH Score: 6   Lactation Tools Discussed/Used Breast pump type: Double-Electric Breast Pump Pump Education: Setup, frequency, and cleaning Reason for Pumping: NICU Pumping frequency: rec q 3 hours Pumped volume: 0 mL  Interventions Interventions: Breast feeding basics reviewed;Assisted with latch;Skin to skin;Hand express;Breast compression;Adjust position;Support pillows;DEBP;Education  Discharge Pump: DEBP;Personal (Motif [name])  Consult Status Consult Status: Follow-up Date: 05/11/21 Follow-up type: In-patient    Lenore Manner 05/11/2021, 10:03 AM

## 2021-05-11 NOTE — Lactation Note (Signed)
This note was copied from a baby's chart. Lactation Consultation Note  Patient Name: Melanie Cooley Date: 05/11/2021 Reason for consult: Follow-up assessment;Engorgement;Mother's request (breast pump question) Age:38 hours  Patient called for assistance with pumping and to ask about milk coming in. Mom brought out her personal pump and asked about suction. LC did not demonstrate but noticed there were no duck valves. Breast tissue is dense, warm and are very full. Went over the importance of frequent milk removal, every 3 hours and to get 8 or more sessions in a 24 hour period. Educated hand expression and taught partner, also went over the benefits to relieve any discomfort.   Parents are now considering WIC pump. Placed referral.   Interventions Engorgement management.   Discharge [place] Program: Yes   Consult Status Consult Status: Follow-up Date: 05/11/21 Follow-up type: In-patient    Eli Hose 05/11/2021, 3:51 PM

## 2021-05-11 NOTE — Plan of Care (Signed)
°  Problem: Clinical Measurements: Goal: Ability to maintain clinical measurements within normal limits will improve Outcome: Progressing Goal: Diagnostic test results will improve Outcome: Progressing Goal: Cardiovascular complication will be avoided Outcome: Progressing   Problem: Activity: Goal: Risk for activity intolerance will decrease Outcome: Progressing   Problem: Coping: Goal: Level of anxiety will decrease Outcome: Progressing   Problem: Elimination: Goal: Will not experience complications related to bowel motility Outcome: Progressing Goal: Will not experience complications related to urinary retention Outcome: Progressing   Problem: Pain Managment: Goal: General experience of comfort will improve Outcome: Progressing   Problem: Safety: Goal: Ability to remain free from injury will improve Outcome: Progressing   Problem: Skin Integrity: Goal: Risk for impaired skin integrity will decrease Outcome: Progressing   Problem: Education: Goal: Knowledge of disease or condition will improve Outcome: Progressing Goal: Knowledge of the prescribed therapeutic regimen will improve Outcome: Progressing Goal: Individualized Educational Video(s) Outcome: Progressing   Problem: Clinical Measurements: Goal: Complications related to the disease process, condition or treatment will be avoided or minimized Outcome: Progressing   Problem: Education: Goal: Knowledge of condition will improve Outcome: Progressing Goal: Individualized Educational Video(s) Outcome: Progressing Goal: Individualized Newborn Educational Video(s) Outcome: Progressing   Problem: Activity: Goal: Will verbalize the importance of balancing activity with adequate rest periods Outcome: Progressing Goal: Ability to tolerate increased activity will improve Outcome: Progressing   Problem: Coping: Goal: Ability to identify and utilize available resources and services will improve Outcome:  Progressing   Problem: Life Cycle: Goal: Chance of risk for complications during the postpartum period will decrease Outcome: Progressing   Problem: Role Relationship: Goal: Ability to demonstrate positive interaction with newborn will improve Outcome: Progressing   Problem: Skin Integrity: Goal: Demonstration of wound healing without infection will improve Outcome: Progressing

## 2021-05-11 NOTE — Progress Notes (Signed)
Pod #2  No c/o, tol po; ambulating, +flatus; voiding w/o difficulty; pain controlled - does notice numbness and burning sensation at incision; nml lochia  Patient Vitals for the past 24 hrs:  BP Temp Temp src Pulse Resp SpO2 Weight  05/11/21 0742 136/84 98.8 F (37.1 C) Oral (!) 116 18 100 % --  05/11/21 0528 -- -- -- -- -- -- 90.8 kg  05/11/21 0514 124/72 98.2 F (36.8 C) Oral 95 17 -- --  05/10/21 2313 123/65 98.2 F (36.8 C) Oral 98 18 100 % --  05/10/21 1958 130/79 -- -- (!) 106 -- -- --  05/10/21 1716 130/69 98.9 F (37.2 C) Oral (!) 102 18 100 % --  05/10/21 1140 134/76 98.2 F (36.8 C) Oral 97 18 99 % --   Intake/Output Summary (Last 24 hours) at 05/11/2021 1136 Last data filed at 05/11/2021 0745 Gross per 24 hour  Intake 600 ml  Output 1750 ml  Net -1150 ml     A&ox3 Rrr Ctab Abd: +bs, soft,nt, nd; fundus firm and below umb; dressing: c/d/I LE: tr edema,nt bilat  CBC Latest Ref Rng & Units 05/09/2021 05/08/2021 05/05/2021  WBC 4.0 - 10.5 K/uL 19.3(H) 12.1(H) 11.7(H)  Hemoglobin 12.0 - 15.0 g/dL 9.7(L) 11.6(L) 12.1  Hematocrit 36.0 - 46.0 % 29.1(L) 34.8(L) 35.4(L)  Platelets 150 - 400 K/uL 264 265 253   A/P: pod2 s/p 1ltcs for severe pre-e Doing well, now 24 hr s/p mag sulfate; feels good; bps controlled on procardia; plan d/c home today, f/u in office in 4 days; pt will check bps periodically at home and parameters for severe bps given Acute anemia d/t blood loss - asymptomatic, iron q day Anxiety - buspar prn, will also f/u in 4 d for anxiety Baby in NICU, doing well per pt, low bs and needing to work on feeds  RH pos RI

## 2021-05-11 NOTE — Progress Notes (Signed)
Discharge instructions reviewed with patient around 1455, all questions and concerns addressed, and patient verbalized understanding. Follow-up care discussed, medications, when to call the provider, HTN in postpartum, and c-section recovery information discussed. Patient requested to see [facility] before leaving and RN called. Patient alert and oriented x4, ambulatory, and VS and pain stable. Patient stated will go to NICU for feeding and then will come back to collect personal items in room.

## 2021-05-11 NOTE — MAU Note (Signed)
Pt reports she was seen by her doctor today and was given the option of going home or staying in the hospital for an additional day. Pt reports she chose to go home. Pt reports she got home and felt like her blood pressure was high. Pt states she was only at home for 1 hour before calling EMS.

## 2021-05-11 NOTE — Clinical Social Work Maternal (Signed)
CLINICAL SOCIAL WORK MATERNAL/CHILD NOTE  Patient Details  Name: Melanie Cooley MRN: [id number] Date of Birth: 1985-10-31  Date:  05/11/2021  Clinical Social Worker Initiating Note:  Laurey Arrow Date/Time: Initiated:  05/11/21/1501     Child's Name:  Gilmore Laroche   Biological Parents:  Mother, Father   Need for Interpreter:  None   Reason for Referral:  Behavioral Health Concerns   Address:  [place] [address]    Phone number:  [phone] (home)     Additional phone number: FOB's number is [phone]  Household Members/Support Persons (HM/SP):   Household Member/Support Person 1   HM/SP Name Relationship DOB or Age  HM/SP -1 Benjamine Sprague FOB 12/07/1985  HM/SP -2        HM/SP -3        HM/SP -4        HM/SP -5        HM/SP -6        HM/SP -7        HM/SP -8          Natural Supports (not living in the home):  Extended Family, Immediate Family, Parent (MOB reported that FOB's family will be a support from a distance.)   Professional Supports: Therapist   Employment: Animator   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   Education:  Forensic psychologist   Homebound arranged:    Museum/gallery curator Resources:  Medicaid   Other Resources:   (CSW provided MOB with information to apply for [employer] and Physicist, medical.)   Cultural/Religious Considerations Which May Impact Care:  None reported  Strengths:  Ability to meet basic needs  , Pediatrician chosen, Compliance with medical plan  , Home prepared for child  , Understanding of illness, Psychotropic Medications   Psychotropic Medications:  Buspar      Pediatrician:    [place]  Pediatrician List:   [facility] [place]      Pediatrician Fax Number:    Risk Factors/Current Problems:  Mental Health Concerns     Cognitive State:  Linear Thinking  ,  Insightful  , Alert  , Able to Concentrate  , Goal Oriented     Mood/Affect:  Comfortable  , Interested  , Happy  , Relaxed     CSW Assessment: CSW met with MOB and FOB at MOB's beside in room 106 to complete an assessment for MH hx. When CSW arrived, MOB was resting in bed and FOB was preparing for MOB's discharge.  CSW explained CSW's role and MOB gave CSW permission to complete the assessment while FOB was present. FOB appeared to be a support to MOB and he engaged with CSW.  MOB was polite, easy to engage,  and receptive to meeting with CSW.   CSW asked about MOB's MH hx.  MOB shared that she was dx with Bipolar years ago however it is now considered to be a missed dx.  Per MOB she has a true dx of anxiety and depression.  MOB reported that she was started on Buspar during her 3 week hospital admission. When CSW asked about MOB's current emotions, MOB stated, "I feel better than I expected."  CSW assessed for safety and MOB denied SI, HI, AH, and VH. MOB shared that she is established with a therapist  and she feels comfortable seeking help if needed.  MOB also reported having a great support team.   CSW provided education regarding the baby blues period vs. perinatal mood disorders, discussed treatment and gave resources for mental health follow up if concerns arise.  CSW recommends self-evaluation during the postpartum time period using the New Mom Checklist from Postpartum Progress and encouraged MOB to contact a medical professional if symptoms are noted at any time.  MOB presented with insight and awareness and did not display any acute MH symptoms.  CSW will continue to offer resources and supports to family while infant remains in NICU.    CSW Plan/Description:  Psychosocial Support and Ongoing Assessment of Needs, Perinatal Mood and Anxiety Disorder (PMADs) Education, Other Patient/Family Education, Other Information/Referral to [employer], MSW, [employer]  Social Work [phone]  Dimple Nanas, LCSW 05/11/2021, 3:05 PM

## 2021-05-11 NOTE — Discharge Summary (Signed)
Postpartum Discharge Summary  Date of Service updated     Patient Name: Melanie Cooley DOB: [birth year] MRN: [id number]  Date of admission: 04/25/2021 Delivery date:05/09/2021  Delivering provider: MODY, Melanie  Date of discharge: 05/11/2021  Admitting diagnosis: Intrauterine growth restriction affecting antepartum care of mother [O36.5990] IUGR (intrauterine growth restriction) affecting care of mother [O36.5990] Delivery by emergency cesarean [O99.892] Intrauterine pregnancy: [redacted]w[redacted]d     Secondary diagnosis:  Principal Problem:   Intrauterine growth restriction affecting antepartum care of mother Active Problems:   IUGR (intrauterine growth restriction) affecting care of mother   Delivery by emergency cesarean  Additional problems: severe pre-eclampsia    Discharge diagnosis: Preterm Pregnancy Delivered, Preeclampsia (severe), and Anemia                                              Post partum procedures: none Augmentation: N/A Complications: None  Hospital course: Sceduled C/S   36 y.o. yo G3P0121 at [redacted]w[redacted]d was admitted to the hospital 04/25/2021 for severe IUGR and GHTN.  She then had worsening bps and new diagnosis of severe pre-eclampsia and decision was made for delivery.  D/t severe IUGR, she then had a 1ltcs.  cesarean section with the following indication: severe IUGR, severe pre-e .Delivery details are as follows:  Membrane Rupture Time/Date: 2:39 AM ,05/09/2021   Delivery Method:C-Section, Low Transverse  Details of operation can be found in separate operative note.  Patient had an uncomplicated postpartum course.  She is ambulating, tolerating a regular diet, passing flatus, and urinating well. Patient is discharged home in stable condition on  05/11/21        Newborn Data: Birth date:05/09/2021  Birth time:2:40 AM  Gender:Female  Living status:Living  Apgars: ,  Weight:1620 g     Magnesium Sulfate received: Yes: Seizure prophylaxis BMZ received:  Yes Rhophylac:No MPhysical exam  Vitals:   05/10/21 2313 05/11/21 0514 05/11/21 0528 05/11/21 0742  BP: 123/65 124/72  136/84  Pulse: 98 95  (!) 116  Resp: 18 17  18   Temp: 98.2 F (36.8 C) 98.2 F (36.8 C)  98.8 F (37.1 C)  TempSrc: Oral Oral  Oral  SpO2: 100%   100%  Weight:   90.8 kg   Height:       General: alert, cooperative, and no distress Lochia: appropriate Uterine Fundus: firm Incision: Healing well with no significant drainage DVT Evaluation: No evidence of DVT seen on physical exam. Labs: Lab Results  Component Value Date   WBC 19.3 (H) 05/09/2021   HGB 9.7 (L) 05/09/2021   HCT 29.1 (L) 05/09/2021   MCV 89.3 05/09/2021   PLT 264 05/09/2021   CMP Latest Ref Rng & Units 05/08/2021  Glucose 70 - 99 mg/dL 89  BUN 6 - 20 mg/dL 13  Creatinine 0.44 - 1.00 mg/dL 0.60  Sodium 135 - 145 mmol/L 132(L)  Potassium 3.5 - 5.1 mmol/L 3.9  Chloride 98 - 111 mmol/L 102  CO2 22 - 32 mmol/L 22  Calcium 8.9 - 10.3 mg/dL 9.1  Total Protein 6.5 - 8.1 g/dL 6.2(L)  Total Bilirubin 0.3 - 1.2 mg/dL 0.4  Alkaline Phos 38 - 126 U/L 122  AST 15 - 41 U/L 25  ALT 0 - 44 U/L 16   Edinburgh Score: No flowsheet data found.    After visit meds:  Allergies as of 05/11/2021  Reactions   Compazine [prochlorperazine] Other (See Comments)   psychosis        Medication List     STOP taking these medications    aspirin EC 81 MG tablet   labetalol 100 MG tablet Commonly known as: NORMODYNE       TAKE these medications    albuterol 108 (90 Base) MCG/ACT inhaler Commonly known as: VENTOLIN HFA Inhale into the lungs every 6 (six) hours as needed for wheezing or shortness of breath.   ferrous sulfate 324 MG Tbec Take 1 tablet (324 mg total) by mouth daily with breakfast.   ibuprofen 600 MG tablet Commonly known as: ADVIL Take 1 tablet (600 mg total) by mouth every 6 (six) hours.   magnesium gluconate 500 MG tablet Commonly known as: MAGONATE Take 500 mg by  mouth daily.   NIFEdipine 30 MG 24 hr tablet Commonly known as: ADALAT CC Take 1 tablet (30 mg total) by mouth daily. Start taking on: May 12, 2021   oxyCODONE 5 MG immediate release tablet Commonly known as: Oxy IR/ROXICODONE Take 1 tablet (5 mg total) by mouth every 6 (six) hours as needed for moderate pain.   prenatal multivitamin Tabs tablet Take 1 tablet by mouth daily at 12 noon.         Discharge home in stable condition Infant Feeding: Breast Infant Disposition:NICU Discharge instruction: per After Visit Summary and Postpartum booklet. Activity: Advance as tolerated. Pelvic rest for 6 weeks.  Diet: low salt diet Anticipated Birth Control: Unsure Postpartum Appointment:6 weeks Additional Postpartum F/U: BP check 4days Future Appointments:No future appointments. Follow up Visit:  Follow-up Information     Cooley, Melanie A, DO Follow up.   Specialty: Obstetrics and Gynecology Contact information: [address] [place] [address] [phone]                     05/11/2021 Melanie Bigger, MD

## 2021-05-11 NOTE — Plan of Care (Signed)
Problem: Clinical Measurements: Goal: Ability to maintain clinical measurements within normal limits will improve 05/11/2021 1241 by Romualdo Bolk, RN Outcome: Adequate for Discharge 05/11/2021 0836 by Romualdo Bolk, RN Outcome: Progressing Goal: Diagnostic test results will improve 05/11/2021 1241 by Romualdo Bolk, RN Outcome: Adequate for Discharge 05/11/2021 0836 by Romualdo Bolk, RN Outcome: Progressing Goal: Cardiovascular complication will be avoided 05/11/2021 1241 by Romualdo Bolk, RN Outcome: Adequate for Discharge 05/11/2021 0836 by Romualdo Bolk, RN Outcome: Progressing   Problem: Activity: Goal: Risk for activity intolerance will decrease 05/11/2021 1241 by Romualdo Bolk, RN Outcome: Adequate for Discharge 05/11/2021 0836 by Romualdo Bolk, RN Outcome: Progressing   Problem: Coping: Goal: Level of anxiety will decrease 05/11/2021 1241 by Romualdo Bolk, RN Outcome: Adequate for Discharge 05/11/2021 0836 by Romualdo Bolk, RN Outcome: Progressing   Problem: Elimination: Goal: Will not experience complications related to bowel motility 05/11/2021 1241 by Romualdo Bolk, RN Outcome: Adequate for Discharge 05/11/2021 0836 by Romualdo Bolk, RN Outcome: Progressing Goal: Will not experience complications related to urinary retention 05/11/2021 1241 by Romualdo Bolk, RN Outcome: Adequate for Discharge 05/11/2021 0836 by Romualdo Bolk, RN Outcome: Progressing   Problem: Pain Managment: Goal: General experience of comfort will improve 05/11/2021 1241 by Romualdo Bolk, RN Outcome: Adequate for Discharge 05/11/2021 0836 by Romualdo Bolk, RN Outcome: Progressing   Problem: Safety: Goal: Ability to remain free from injury will improve 05/11/2021 1241 by Romualdo Bolk, RN Outcome: Adequate for Discharge 05/11/2021 0836 by Romualdo Bolk, RN Outcome: Progressing   Problem: Skin Integrity: Goal:  Risk for impaired skin integrity will decrease 05/11/2021 1241 by Romualdo Bolk, RN Outcome: Adequate for Discharge 05/11/2021 0836 by Romualdo Bolk, RN Outcome: Progressing   Problem: Education: Goal: Knowledge of disease or condition will improve 05/11/2021 1241 by Romualdo Bolk, RN Outcome: Adequate for Discharge 05/11/2021 0836 by Romualdo Bolk, RN Outcome: Progressing Goal: Knowledge of the prescribed therapeutic regimen will improve 05/11/2021 1241 by Romualdo Bolk, RN Outcome: Adequate for Discharge 05/11/2021 0836 by Romualdo Bolk, RN Outcome: Progressing Goal: Individualized Educational Video(s) 05/11/2021 1241 by Romualdo Bolk, RN Outcome: Adequate for Discharge 05/11/2021 0836 by Romualdo Bolk, RN Outcome: Progressing   Problem: Clinical Measurements: Goal: Complications related to the disease process, condition or treatment will be avoided or minimized 05/11/2021 1241 by Romualdo Bolk, RN Outcome: Adequate for Discharge 05/11/2021 0836 by Romualdo Bolk, RN Outcome: Progressing   Problem: Education: Goal: Knowledge of condition will improve 05/11/2021 1241 by Romualdo Bolk, RN Outcome: Adequate for Discharge 05/11/2021 0836 by Romualdo Bolk, RN Outcome: Progressing Goal: Individualized Educational Video(s) 05/11/2021 1241 by Romualdo Bolk, RN Outcome: Adequate for Discharge 05/11/2021 0836 by Romualdo Bolk, RN Outcome: Progressing Goal: Individualized Newborn Educational Video(s) 05/11/2021 1241 by Romualdo Bolk, RN Outcome: Adequate for Discharge 05/11/2021 0836 by Romualdo Bolk, RN Outcome: Progressing   Problem: Activity: Goal: Will verbalize the importance of balancing activity with adequate rest periods 05/11/2021 1241 by Romualdo Bolk, RN Outcome: Adequate for Discharge 05/11/2021 0836 by Romualdo Bolk, RN Outcome: Progressing Goal: Ability to tolerate increased activity will  improve 05/11/2021 1241 by Romualdo Bolk, RN Outcome: Adequate for Discharge 05/11/2021 0836 by Romualdo Bolk, RN Outcome: Progressing   Problem: Coping: Goal: Ability to identify and utilize available resources and services will improve 05/11/2021 1241 by Romualdo Bolk, RN  Outcome: Adequate for Discharge 05/11/2021 0836 by Romualdo Bolk, RN Outcome: Progressing   Problem: Life Cycle: Goal: Chance of risk for complications during the postpartum period will decrease 05/11/2021 1241 by Romualdo Bolk, RN Outcome: Adequate for Discharge 05/11/2021 0836 by Romualdo Bolk, RN Outcome: Progressing   Problem: Role Relationship: Goal: Ability to demonstrate positive interaction with newborn will improve 05/11/2021 1241 by Romualdo Bolk, RN Outcome: Adequate for Discharge 05/11/2021 0836 by Romualdo Bolk, RN Outcome: Progressing   Problem: Skin Integrity: Goal: Demonstration of wound healing without infection will improve 05/11/2021 1241 by Romualdo Bolk, RN Outcome: Adequate for Discharge 05/11/2021 0836 by Romualdo Bolk, RN Outcome: Progressing

## 2021-05-11 NOTE — MAU Provider Note (Signed)
Chief Complaint: Hypertension   Event Date/Time   First Provider Initiated Contact with Patient 05/11/21 2201      SUBJECTIVE HPI: Melanie Cooley is a 36 y.o. G2X5284 on POD#3 following PLTCS who presents to maternity admissions reporting elevated BP with headache at home. She was delivered at 35 weeks for preeclampsia with severe features and was discharged today on Procardia 30 mg daily, which she took as prescribed.  When she felt a h/a at home, she took her BP and it was 140s/90s. She waited 15 minutes then retook it and it was 150/100. She was worried that the same process was happening that brought her in to the hospital during the pregnancy was repeating itself and she called EMS and came to MAU.  She denies h/a, visual disturbances, or epigastric pain in MAU.     Past Medical History:  Diagnosis Date   Anxiety    Cancer ([place])    Chronic kidney disease    Depression    Past Surgical History:  Procedure Laterality Date   CESAREAN SECTION N/A 05/09/2021   Procedure: Primary CESAREAN SECTION;  Surgeon: Azucena Fallen, MD;  Location: [facility];  Service: Obstetrics;  Laterality: N/A;  EDD: 06/09/21   INDUCED ABORTION     NEPHRECTOMY Right    Partial due to cancer   Social History   Socioeconomic History   Marital status: Divorced    Spouse name: Not on file   Number of children: Not on file   Years of education: Not on file   Highest education level: Not on file  Occupational History   Not on file  Tobacco Use   Smoking status: Never   Smokeless tobacco: Never  Vaping Use   Vaping Use: Never used  Substance and Sexual Activity   Alcohol use: Never   Drug use: Never   Sexual activity: Yes  Other Topics Concern   Not on file  Social History Narrative   Not on file   Social Determinants of Health   Financial Resource Strain: Not on file  Food Insecurity: Not on file  Transportation Needs: Not on file  Physical Activity: Not on file  Stress: Not on file  Social  Connections: Not on file  Intimate Partner Violence: Not on file   No current facility-administered medications on file prior to encounter.   Current Outpatient Medications on File Prior to Encounter  Medication Sig Dispense Refill   oxyCODONE (OXY IR/ROXICODONE) 5 MG immediate release tablet Take 1 tablet (5 mg total) by mouth every 6 (six) hours as needed for moderate pain. 30 tablet 0   albuterol (VENTOLIN HFA) 108 (90 Base) MCG/ACT inhaler Inhale into the lungs every 6 (six) hours as needed for wheezing or shortness of breath.     ferrous sulfate 324 MG TBEC Take 1 tablet (324 mg total) by mouth daily with breakfast. 30 tablet 3   ibuprofen (ADVIL) 600 MG tablet Take 1 tablet (600 mg total) by mouth every 6 (six) hours. 30 tablet 0   magnesium gluconate (MAGONATE) 500 MG tablet Take 500 mg by mouth daily.     NIFEdipine (ADALAT CC) 30 MG 24 hr tablet Take 1 tablet (30 mg total) by mouth daily. 30 tablet 0   Prenatal Vit-Fe Fumarate-FA (PRENATAL MULTIVITAMIN) TABS tablet Take 1 tablet by mouth daily at 12 noon.     Allergies  Allergen Reactions   Compazine [Prochlorperazine] Other (See Comments)    psychosis   Tape Other (See Comments)  Burning, rash    ROS:  Review of Systems  Constitutional:  Negative for chills, fatigue and fever.  Eyes:  Negative for visual disturbance.  Respiratory:  Negative for shortness of breath.   Cardiovascular:  Negative for chest pain.  Gastrointestinal:  Negative for abdominal pain, nausea and vomiting.  Genitourinary:  Negative for difficulty urinating, dysuria, flank pain, pelvic pain, vaginal bleeding, vaginal discharge and vaginal pain.  Neurological:  Negative for dizziness and headaches.  Psychiatric/Behavioral: Negative.      I have reviewed patient's Past Medical Hx, Surgical Hx, Family Hx, Social Hx, medications and allergies.   Physical Exam  Patient Vitals for the past 24 hrs:  BP Temp Pulse Resp SpO2  05/11/21 2346 134/78 -- (!)  106 -- --  05/11/21 2331 140/83 -- (!) 105 -- --  05/11/21 2246 130/82 -- (!) 112 -- --  05/11/21 2231 (!) 141/87 -- (!) 112 -- --  05/11/21 2216 (!) 146/89 -- (!) 122 -- --  05/11/21 2201 140/84 -- (!) 122 -- --  05/11/21 2146 (!) 141/85 -- (!) 123 -- --  05/11/21 2135 (!) 146/95 -- (!) 117 -- 97 %  05/11/21 2134 -- 98.1 F (36.7 C) -- 20 96 %   Constitutional: Well-developed, well-nourished female in no acute distress.  HEART: normal rate, heart sounds, regular rhythm RESP: normal effort, lung sounds clear and equal bilaterally GI: Abd soft, non-tender. Pos BS x 4 MS: Extremities nontender, no edema, normal ROM Neurologic: Alert and oriented x 4.  GU: Neg CVAT.  PELVIC EXAM:     LAB RESULTS Results for orders placed or performed during the hospital encounter of 05/11/21 (from the past 24 hour(s))  CBC     Status: Abnormal   Collection Time: 05/11/21 10:13 PM  Result Value Ref Range   WBC 13.3 (H) 4.0 - 10.5 K/uL   RBC 3.08 (L) 3.87 - 5.11 MIL/uL   Hemoglobin 9.3 (L) 12.0 - 15.0 g/dL   HCT 27.7 (L) 36.0 - 46.0 %   MCV 89.9 80.0 - 100.0 fL   MCH 30.2 26.0 - 34.0 pg   MCHC 33.6 30.0 - 36.0 g/dL   RDW 13.7 11.5 - 15.5 %   Platelets 341 150 - 400 K/uL   nRBC 0.0 0.0 - 0.2 %  Comprehensive metabolic panel     Status: Abnormal   Collection Time: 05/11/21 10:13 PM  Result Value Ref Range   Sodium 138 135 - 145 mmol/L   Potassium 4.1 3.5 - 5.1 mmol/L   Chloride 102 98 - 111 mmol/L   CO2 23 22 - 32 mmol/L   Glucose, Bld 97 70 - 99 mg/dL   BUN 10 6 - 20 mg/dL   Creatinine, Ser 0.60 0.44 - 1.00 mg/dL   Calcium 8.9 8.9 - 10.3 mg/dL   Total Protein 6.4 (L) 6.5 - 8.1 g/dL   Albumin 2.8 (L) 3.5 - 5.0 g/dL   AST 40 15 - 41 U/L   ALT 29 0 - 44 U/L   Alkaline Phosphatase 87 38 - 126 U/L   Total Bilirubin 0.2 (L) 0.3 - 1.2 mg/dL   GFR, Estimated >60 >60 mL/min   Anion gap 13 5 - 15    --/--/A POS (01/23 1227)  IMAGING  MAU Management/MDM: Orders Placed This Encounter   Procedures   CBC   Comprehensive metabolic panel   Discharge patient    No orders of the defined types were placed in this encounter.   PEC labs wnl,  pt without s/sx of PEC in MAU.  No severe range BPs.  Consult Dr Ilda Basset. D/C home with close outpatient f/u.  Pt honeycomb dressing on incision saturated so removed in MAU today. Steristrips saturated with dry dark blood so removed.  In places where steristrip was in contact with pt skin, significant edema/erythema.  Incision clean/dry/intact, well approximated.  Consult Dr Ilda Basset regarding incision, and given pt reaction to tape and well appearing incision, Ok to leave dressing off and have pt f/u tomorrow in office. D/C home with PEC precautions.  ASSESSMENT 1. Postpartum hypertension     PLAN Discharge home Allergies as of 05/12/2021       Reactions   Compazine [prochlorperazine] Other (See Comments)   psychosis   Tape Other (See Comments)   Burning, rash        Medication List     TAKE these medications    albuterol 108 (90 Base) MCG/ACT inhaler Commonly known as: VENTOLIN HFA Inhale into the lungs every 6 (six) hours as needed for wheezing or shortness of breath.   ferrous sulfate 324 MG Tbec Take 1 tablet (324 mg total) by mouth daily with breakfast.   ibuprofen 600 MG tablet Commonly known as: ADVIL Take 1 tablet (600 mg total) by mouth every 6 (six) hours.   magnesium gluconate 500 MG tablet Commonly known as: MAGONATE Take 500 mg by mouth daily.   NIFEdipine 30 MG 24 hr tablet Commonly known as: ADALAT CC Take 1 tablet (30 mg total) by mouth daily.   oxyCODONE 5 MG immediate release tablet Commonly known as: Oxy IR/ROXICODONE Take 1 tablet (5 mg total) by mouth every 6 (six) hours as needed for moderate pain.   prenatal multivitamin Tabs tablet Take 1 tablet by mouth daily at 12 noon.        Follow-up Information     [facility], [facility] Follow up in 1 day(s).   Why: For BP check Contact  information: [place] [address] [phone]         [facility] Follow up.   Specialty: Obstetrics and Gynecology Why: As needed for emergencies Contact information: [address] [phone] [place] Certified Nurse-Midwife 05/12/2021  3:30 AM

## 2021-05-12 ENCOUNTER — Ambulatory Visit: Payer: Managed Care, Other (non HMO)

## 2021-05-12 ENCOUNTER — Inpatient Hospital Stay (EMERGENCY_DEPARTMENT_HOSPITAL)
Admission: AD | Admit: 2021-05-12 | Discharge: 2021-05-12 | Disposition: A | Discharge status: Home / Self Care | Payer: Commercial Managed Care - HMO | Source: Home / Self Care | Attending: Obstetrics and Gynecology | Admitting: Obstetrics and Gynecology

## 2021-05-12 ENCOUNTER — Encounter (HOSPITAL_COMMUNITY): Payer: Self-pay | Admitting: Obstetrics and Gynecology

## 2021-05-12 ENCOUNTER — Ambulatory Visit: Payer: Self-pay

## 2021-05-12 DIAGNOSIS — R519 Headache, unspecified: Secondary | ICD-10-CM | POA: Insufficient documentation

## 2021-05-12 DIAGNOSIS — O165 Unspecified maternal hypertension, complicating the puerperium: Secondary | ICD-10-CM | POA: Insufficient documentation

## 2021-05-12 DIAGNOSIS — H538 Other visual disturbances: Secondary | ICD-10-CM | POA: Insufficient documentation

## 2021-05-12 DIAGNOSIS — Z79899 Other long term (current) drug therapy: Secondary | ICD-10-CM | POA: Insufficient documentation

## 2021-05-12 DIAGNOSIS — O9089 Other complications of the puerperium, not elsewhere classified: Secondary | ICD-10-CM | POA: Insufficient documentation

## 2021-05-12 NOTE — Discharge Instructions (Signed)
Check your blood pressure twice daily and keep a log. Keep taking your medications as prescribed.   If you have a severe headache not improving with your medications, persistent blurred vision, persistent extremity weakness/numbness, please be seen.

## 2021-05-12 NOTE — MAU Note (Signed)
PP G3P1 presenting asking for a BP check. States she was at her doctor this morning for a BP check and they added labetalol and lasix to the procardia that she is already taking. Endorses slight headache and "not feeling well" and said she would rather just get her BP checked here since her baby is in the NICU rather than going home to get her BP cuff. Endorses blurry vision "off and on", denies RUQ pain.

## 2021-05-12 NOTE — Lactation Note (Signed)
This note was copied from a baby's chart.  NICU Lactation Consultation Note  Patient Name: Melanie Cooley [id number] Date: 05/12/2021 Age:38 days   Subjective Reason for consult: Mother's request; Follow-up assessment; Engorgement Mother is uncomfortable this morning. Her breasts are full and she pumped through the night with low milk output. LC provided ice/heat along with review of pumping strategies to relieve engorgement. LC assisted with pumping during visit.   Objective Infant data: Mother's Current Feeding Choice: Breast Milk and Donor Milk  Infant feeding assessment Scale for Readiness: 3 Scale for Quality: 3   Maternal data: [id number]  C-Section, Low Transverse Current breast feeding challenges:: onset of engorgement on day 3 pp  Does the patient have breastfeeding experience prior to this delivery?: No  Pumping frequency: q3 Pumped volume: 10 mL   WIC Program: Yes WIC Referral Sent?: Yes (considering [facility]) Pump: Personal, DEBP (Motif luna)  Assessment Infant: LATCH Score: 6  Feeding Status: IDF-2   Maternal: Milk volume: Normal   Intervention/Plan Interventions: Education; Ice  Pump Education: Setup, frequency, and cleaning  Plan: Consult Status: Follow-up  NICU Follow-up type: Weekly NICU follow up; Verify absence of engorgement  Mother will ice breasts between pumpings and apply heat sparingly during pumping sessions.  Mother will pump q2-3 today to relieve engorgement.   [name] 05/12/2021, 10:46 AM

## 2021-05-12 NOTE — Lactation Note (Addendum)
This note was copied from a baby's chart. Lactation Consultation Note LC returned to assist with pumping and demonstration of lymphatic drainage massage. Reviewed strategies to resolve engorgement. Mother pumped 42mL p massage and felt slight improvement in comfort. She is aware of need to continue massage / pumping through the night.   Patient Name: Melanie Cooley [id number] Date: 05/12/2021 Reason for consult: Follow-up assessment;Engorgement Age:38 days  Interventions Interventions: Education  Consult Status Consult Status: Follow-up Date: 05/12/21 Follow-up type: In-patient   Melanie Cooley 05/12/2021, 4:24 PM

## 2021-05-12 NOTE — Lactation Note (Signed)
This note was copied from a baby's chart.  NICU Lactation Consultation Note  Patient Name: Melanie Cooley [id number] Date: 05/12/2021 Age:38 days   Subjective Reason for consult: Follow-up assessment; Engorgement Mother continues to use engorgement strategies and pump frequently. Milk is beginning to move on R breast but L breast is more uncomfortable this afternoon. LC provided additional ice and will return to further assist with pumping after mother completes sts with infant.   Objective Infant data: Mother's Current Feeding Choice: Breast Milk and Donor Milk  Infant feeding assessment Scale for Readiness: 3 Scale for Quality: 3   Maternal data: [id number]  C-Section, Low Transverse  Current breast feeding challenges:: onset of engorgement on day 3 pp  Does the patient have breastfeeding experience prior to this delivery?: No  Pumping frequency: q3 Pumped volume: 10 mL  WIC Program: Yes WIC Referral Sent?: Yes (considering [facility]) Pump: Personal, DEBP (Motif luna)  Assessment Infant: LATCH Score: 6  Feeding Status: IDF-2  Maternal: Milk volume: Normal   Intervention/Plan Interventions: Education  Pump Education: Setup, frequency, and cleaning  Plan: Consult Status: Follow-up  NICU Follow-up type: Weekly NICU follow up; Verify absence of engorgement   [name] 05/12/2021, 3:20 PM

## 2021-05-12 NOTE — MAU Provider Note (Signed)
History     CSN: [id number]  Arrival date and time: 05/12/21 1747   Chief Complaint  Patient presents with   Hypertension   Melanie Cooley is a 36 yo female G3P1021 on POD#4 following a pLTCS, with a history of pre-eclampsia with SF, presenting for a blood pressure check.   She saw her OB provider this morning, they added labetalol BID and lasix to start tomorrow morning in addition to her already scheduled procardia. She has started the labetalol as directed. She has been in the NICU all day today and did not have her BP cuff with her. She asked the NICU providers to check her blood pressure, but reported she had to be seen in the MAU for this or have her cuff from home be brought. Otherwise reports she is feeling well, just wanted to her blood pressure checked to make sure it was doing better with the medicine.   However, she reports she has been having a "slight" headache for the past few days and occasionally blurring of her vision. Headache is in her upper neck/occipital. Improved with her tylenol/ibuprofen.Vision is unremarkable currently. She hasn't been sleeping well since late pregnancy.   Seen in the MAU last night around 10pm for her blood pressures. Labs collected and reassuring.    Past Medical History:  Diagnosis Date   Anxiety    Depression    Preeclampsia 2023   Renal cell carcinoma ([place]) 2017    Past Surgical History:  Procedure Laterality Date   CESAREAN SECTION N/A 05/09/2021   Procedure: Primary CESAREAN SECTION;  Surgeon: Azucena Fallen, MD;  Location: [facility];  Service: Obstetrics;  Laterality: N/A;  EDD: 06/09/21   INDUCED ABORTION     NEPHRECTOMY Right 2017   Partial due to cancer    Family History  Problem Relation Age of Onset   Hyperlipidemia Father    Cancer Maternal Grandmother    Cancer Maternal Grandfather    Mesothelioma Paternal Grandmother     Social History   Tobacco Use   Smoking status: Never   Smokeless tobacco: Never  Vaping Use    Vaping Use: Never used  Substance Use Topics   Alcohol use: Never   Drug use: Never    Allergies:  Allergies  Allergen Reactions   Compazine [Prochlorperazine] Other (See Comments)    psychosis   Tape Other (See Comments)    Burning, rash   Betadine [Povidone-Iodine] Rash    Medications Prior to Admission  Medication Sig Dispense Refill Last Dose   furosemide (LASIX) 20 MG tablet Take 20 mg by mouth.      ibuprofen (ADVIL) 600 MG tablet Take 1 tablet (600 mg total) by mouth every 6 (six) hours. 30 tablet 0 05/12/2021   labetalol (NORMODYNE) 100 MG tablet Take 100 mg by mouth 2 (two) times daily.      NIFEdipine (ADALAT CC) 30 MG 24 hr tablet Take 1 tablet (30 mg total) by mouth daily. 30 tablet 0 05/12/2021   oxyCODONE (OXY IR/ROXICODONE) 5 MG immediate release tablet Take 1 tablet (5 mg total) by mouth every 6 (six) hours as needed for moderate pain. 30 tablet 0 05/12/2021   Prenatal Vit-Fe Fumarate-FA (PRENATAL MULTIVITAMIN) TABS tablet Take 1 tablet by mouth daily at 12 noon.   Past Week   albuterol (VENTOLIN HFA) 108 (90 Base) MCG/ACT inhaler Inhale into the lungs every 6 (six) hours as needed for wheezing or shortness of breath.   More than a month   ferrous sulfate  324 MG TBEC Take 1 tablet (324 mg total) by mouth daily with breakfast. 30 tablet 3    magnesium gluconate (MAGONATE) 500 MG tablet Take 500 mg by mouth daily.   More than a month    Review of Systems  Constitutional:  Negative for chills, fatigue and fever.  Respiratory:  Negative for chest tightness and shortness of breath.   Gastrointestinal:  Negative for abdominal pain, nausea and vomiting.  Genitourinary:  Negative for dysuria and pelvic pain.  Skin:  Positive for wound.       From the dressing adhesive at her C/S incision and healing CS incision  Neurological:  Positive for headaches. Negative for dizziness, weakness, light-headedness and numbness.  Physical Exam   Blood pressure 125/82, pulse 97,  temperature 98 F (36.7 C), temperature source Oral, resp. rate 16, height 5\' 6"  (1.676 m), weight 90.7 kg, currently breastfeeding.  Physical Exam Constitutional:      General: She is not in acute distress.    Appearance: Normal appearance. She is not ill-appearing or diaphoretic.  HENT:     Head: Normocephalic and atraumatic.     Mouth/Throat:     Mouth: Mucous membranes are moist.  Neck:     Comments: Tense paracervical and trapezius musculature bilaterally. +trigger points along traps bilaterally.  Cardiovascular:     Rate and Rhythm: Normal rate.  Pulmonary:     Effort: Pulmonary effort is normal.  Abdominal:     Palpations: Abdomen is soft.  Musculoskeletal:     Cervical back: Neck supple.  Skin:    Comments: Well healing and approximated LTCS incision present without any surrounding erythema or pus-like drainage.   Neurological:     Mental Status: She is alert.     Comments: CN 2-12 intact. EOMI, PERRL. Speech normal. Follows complex commands. Able to move all extremities spontaneously and equally. Sensation to light touch intact throughout.   Psychiatric:        Behavior: Behavior normal.    MAU Course   MDM Reviewed reassuring CBC and CMP from yesterday evening's MAU visit.  BP 462-863 systolic while in the MAU   Assessment and Plan   1. Postpartum hypertension Fortunately reassuring since arrival on several checks. Encouraged to continue procardia/labetalol as prescribed and to start lasix tomorrow as instructed. Monitor BP with home BP cuff, f/u with Ob/gyn.   2. Mild headache  Offered tylenol/ibuprofen, however patient declined. Suspect tension HA. Neurologically intact. Performed soft tissue to her neck/trapezius musculature and occipital release with significant improvement/resolution.   MAU return precautions strictly discussed. Discharged home in stable condition.     Patriciaann Clan 05/12/2021, 6:41 PM

## 2021-05-13 ENCOUNTER — Other Ambulatory Visit: Payer: Self-pay

## 2021-05-13 ENCOUNTER — Encounter (HOSPITAL_COMMUNITY): Payer: Self-pay | Admitting: Obstetrics and Gynecology

## 2021-05-13 ENCOUNTER — Inpatient Hospital Stay (HOSPITAL_COMMUNITY)
Admission: AD | Admit: 2021-05-13 | Discharge: 2021-05-13 | Disposition: A | Discharge status: Home / Self Care | Payer: Commercial Managed Care - HMO | Attending: Obstetrics and Gynecology | Admitting: Obstetrics and Gynecology

## 2021-05-13 ENCOUNTER — Ambulatory Visit: Payer: Self-pay

## 2021-05-13 DIAGNOSIS — K5900 Constipation, unspecified: Secondary | ICD-10-CM | POA: Diagnosis not present

## 2021-05-13 NOTE — MAU Note (Signed)
Pt states soap suds enema effective - reported large stool and states she feels "much better"-Pt reports feeling shaky-sitting up to eat. Will take VS after pt finishes eating

## 2021-05-13 NOTE — MAU Note (Signed)
Reviewed medications with pt-states labetalol and lasix were started in the last couple of days . 150/90 discharge BP-pt states she has been 130-150/80-90 since delivery. Pt denies HA, visual changes or epigastric pain. Up to the bathroom to urinate.

## 2021-05-13 NOTE — MAU Provider Note (Signed)
History     CSN: [id number]  Arrival date and time: 05/13/21 1701   Event Date/Time   First Provider Initiated Contact with Patient 05/13/21 1737      Chief Complaint  Patient presents with   Abdominal Pain   HPI Melanie Cooley is a 36 y.o. G1W2993 postpartum from a primary c/s 5 days ago who presents with constipation. She reports she has not been able to have a bowel movement since before her surgery. She reports feeling like she needs to go but has been trying all day with no relief.   OB History     Gravida  3   Para  1   Term      Preterm  1   AB  2   Living  1      SAB  1   IAB  1   Ectopic      Multiple  0   Live Births  1           Past Medical History:  Diagnosis Date   Anxiety    Depression    Preeclampsia 2023   Renal cell carcinoma ([place]) 2017    Past Surgical History:  Procedure Laterality Date   CESAREAN SECTION N/A 05/09/2021   Procedure: Primary CESAREAN SECTION;  Surgeon: Azucena Fallen, MD;  Location: [facility];  Service: Obstetrics;  Laterality: N/A;  EDD: 06/09/21   INDUCED ABORTION     NEPHRECTOMY Right 2017   Partial due to cancer    Family History  Problem Relation Age of Onset   Hyperlipidemia Father    Cancer Maternal Grandmother    Cancer Maternal Grandfather    Mesothelioma Paternal Grandmother     Social History   Tobacco Use   Smoking status: Never   Smokeless tobacco: Never  Vaping Use   Vaping Use: Never used  Substance Use Topics   Alcohol use: Never   Drug use: Never    Allergies:  Allergies  Allergen Reactions   Compazine [Prochlorperazine] Other (See Comments)    psychosis   Tape Other (See Comments)    Burning, rash   Betadine [Povidone-Iodine] Rash    Medications Prior to Admission  Medication Sig Dispense Refill Last Dose   albuterol (VENTOLIN HFA) 108 (90 Base) MCG/ACT inhaler Inhale into the lungs every 6 (six) hours as needed for wheezing or shortness of breath.      ferrous  sulfate 324 MG TBEC Take 1 tablet (324 mg total) by mouth daily with breakfast. 30 tablet 3    furosemide (LASIX) 20 MG tablet Take 20 mg by mouth.      ibuprofen (ADVIL) 600 MG tablet Take 1 tablet (600 mg total) by mouth every 6 (six) hours. 30 tablet 0    labetalol (NORMODYNE) 100 MG tablet Take 100 mg by mouth 2 (two) times daily.      magnesium gluconate (MAGONATE) 500 MG tablet Take 500 mg by mouth daily.      NIFEdipine (ADALAT CC) 30 MG 24 hr tablet Take 1 tablet (30 mg total) by mouth daily. 30 tablet 0    oxyCODONE (OXY IR/ROXICODONE) 5 MG immediate release tablet Take 1 tablet (5 mg total) by mouth every 6 (six) hours as needed for moderate pain. 30 tablet 0    Prenatal Vit-Fe Fumarate-FA (PRENATAL MULTIVITAMIN) TABS tablet Take 1 tablet by mouth daily at 12 noon.       Review of Systems  Constitutional: Negative.  Negative for fatigue and fever.  HENT: Negative.    Respiratory: Negative.  Negative for shortness of breath.   Cardiovascular: Negative.  Negative for chest pain.  Gastrointestinal:  Positive for constipation. Negative for abdominal pain, diarrhea, nausea and vomiting.  Genitourinary: Negative.  Negative for dysuria, vaginal bleeding and vaginal discharge.  Neurological: Negative.  Negative for dizziness and headaches.  Physical Exam   Blood pressure (!) 165/100, pulse (!) 122, temperature 97.8 F (36.6 C), temperature source Oral, resp. rate 18, SpO2 98 %, currently breastfeeding. Patient Vitals for the past 24 hrs:  BP Temp Temp src Pulse Resp SpO2  05/13/21 1736 (!) 165/100 -- -- (!) 122 -- --  05/13/21 1717 (!) 155/93 97.8 F (36.6 C) Oral -- 18 98 %    Physical Exam Vitals and nursing note reviewed.  Constitutional:      General: She is not in acute distress.    Appearance: She is well-developed.  HENT:     Head: Normocephalic.  Eyes:     Pupils: Pupils are equal, round, and reactive to light.  Cardiovascular:     Rate and Rhythm: Normal rate and  regular rhythm.     Heart sounds: Normal heart sounds.  Pulmonary:     Effort: Pulmonary effort is normal. No respiratory distress.     Breath sounds: Normal breath sounds.  Abdominal:     General: Bowel sounds are normal. There is no distension.     Palpations: Abdomen is soft.     Tenderness: There is no abdominal tenderness.  Skin:    General: Skin is warm and dry.  Neurological:     Mental Status: She is alert and oriented to person, place, and time.  Psychiatric:        Mood and Affect: Mood normal.        Behavior: Behavior normal.        Thought Content: Thought content normal.        Judgment: Judgment normal.    MAU Course  Procedures MDM Soap Suds Enema- patient reports significant relief One severe range BP on arrival- patient has not taken night time dose of antihypertensives and was in pain from constipation. Patient took pm dosing and BP rechecked. Repeat improved.   Assessment and Plan   1. Constipation, unspecified constipation type   2. Postpartum state    -Discharge home in stable condition -Constipation precautions discussed -Patient advised to follow-up with OB as scheduled for postpartum care -Patient may return to MAU as needed or if her condition were to change or worsen   Wende Mott CNM 05/13/2021, 5:38 PM

## 2021-05-13 NOTE — MAU Note (Signed)
Pt reports to mau for constipation.  Reports last BM was 5 days ago.  Pt denies changes to vision or headache.  BP 155/93 in triage

## 2021-05-13 NOTE — MAU Note (Signed)
Pt sitting up pumping-to call RN when finished for VS.States the shaking feeling has improved

## 2021-05-13 NOTE — Lactation Note (Signed)
This note was copied from a baby's chart. Lactation Consultation Note  Patient Name: Melanie Cooley [id number] Date: 05/13/2021 Reason for consult: Late-preterm 34-36.6wks;Follow-up assessment;Engorgement;Infant < 6lbs;Primapara;1st time breastfeeding;NICU baby;Other (Comment) (IUGR, SGA, AMA) Age:38 days  Visited with mom of 43 75/70 weeks old (adjusted) NICU female, [place] assisted mom with pumping and provided ice packs. Both breasts show improvement although they still feel firm on the outer quadrants (near the axilla). Reviewed lymphatic massage with mom and advised her to continue with current plan of care.  Maternal Data  Mom's supply is slowly coming in, she's still dealing with some engorgement but in comparison to what it was yesterday, she said it shows improvement.  Feeding Mother's Current Feeding Choice: Breast Milk and Donor Milk  LATCH Score Latch: Too sleepy or reluctant, no latch achieved, no sucking elicited.  Audible Swallowing: None  Type of Nipple: Everted at rest and after stimulation  Comfort (Breast/Nipple): Soft / non-tender  Hold (Positioning): No assistance needed to correctly position infant at breast.  LATCH Score: 6  Lactation Tools Discussed/Used Tools: Pump;Flanges Flange Size: 27 Breast pump type: Double-Electric Breast Pump Pump Education: Setup, frequency, and cleaning;Milk Storage Reason for Pumping: LPI in NICU Pumping frequency: 8 times/24 hours Pumped volume: 17 mL  Interventions  Ice Breast massage Pumping  Plan of care  Encouraged mom to continue pumping every 3 hours, at least 8 times/24 hours Breast massage, hand expression and coconut oil were also encouraged She'll continue icing her breast every 3 hours for 20 minutes prior pumping sessions   FOB present and very supportive. All questions and concerns answered, family to call NICU LC PRN.  Discharge Discharge Education: Engorgement and breast care Pump: DEBP;Personal (Motif  Melanie Cooley)  Consult Status Consult Status: Follow-up Date: 05/13/21 Follow-up type: In-patient   Melanie Cooley 05/13/2021, 11:00 AM

## 2021-05-14 ENCOUNTER — Ambulatory Visit: Payer: Self-pay

## 2021-05-14 NOTE — Lactation Note (Signed)
This note was copied from a baby's chart. Lactation Consultation Note  Patient Name: Melanie Cooley [id number] Date: 05/14/2021 Reason for consult: Late-preterm 34-36.6wks;1st time breastfeeding;Primapara;Infant < 6lbs;Follow-up assessment;Mother's request;NICU baby;Engorgement;Other (Comment) (IUGR. SGA, AMA) Age:38 days  Visited with mom of 78 87/36 weeks old (adjusted) NICU female, she's a P1 and reports improvement on her left breast that was engorged yesterday, but also pointed out that she started feeling discomfort and swelling on her right breast.   This LC assisted with reverse pressure technique, lymphatic and deep tissue massage per mom's request, FOB not present and this time, mom voiced he's usually the one helping her with breast massage. After finishing up, breast felt noticeable softer and less painful per mom. Instructed her to continue icing as usual prior pumping sessions.   Maternal Data  Mom's supply is slowly coming in, she's still dealing with some engorgement this time shifted to the right breast  Feeding Mother's Current Feeding Choice: Breast Milk and Donor Milk  Lactation Tools Discussed/Used Tools: Pump;Flanges;Coconut oil Flange Size: 27 Breast pump type: Double-Electric Breast Pump Pump Education: Setup, frequency, and cleaning;Milk Storage Reason for Pumping: LPI in NICU Pumping frequency: 7-8 times/24 hours Pumped volume: 20 mL  Interventions Interventions: Breast feeding basics reviewed;Breast massage;Reverse pressure;Breast compression;Coconut oil;DEBP;Education  Plan of care Encouraged mom to continue pumping every 3 hours, at least 8 times/24 hours Breast massage and reverse pressure technique were also encouraged She'll continue icing her breast every 3 hours for 20 minutes prior pumping sessions   No other support person at this time. All questions and concerns answered, mom to call NICU LC PRN.  Discharge Discharge Education: Engorgement and  breast care Pump: DEBP;Personal (Motif [name])  Consult Status Consult Status: Follow-up Date: 05/14/21 Follow-up type: In-patient   Rabon Scholle Francene Boyers 05/14/2021, 6:36 PM

## 2021-05-15 ENCOUNTER — Ambulatory Visit: Payer: Self-pay

## 2021-05-15 NOTE — Lactation Note (Signed)
This note was copied from a baby's chart. Lactation Consultation Note Mother continues to pump q 2-3 today. Her breasts are still full but she is beginning to feel some relief. Most recently, she pumped 11mLs. LC will plan f/u to further assess and assist prn.   Patient Name: Melanie Cooley [id number] Date: 05/15/2021   Age:38 days   [name] 05/15/2021, 5:16 PM

## 2021-05-15 NOTE — Lactation Note (Signed)
This note was copied from a baby's chart.  NICU Lactation Consultation Note  Patient Name: Melanie Cooley [id number] Date: 05/15/2021 Age:36 years   Subjective Reason for consult: Follow-up assessment; Engorgement  Mother continues to complain of breast fullness and discomfort, R>L. She endorses frequent pumping and is using massage, ice, and heat as needed.  Mother has pump at home but would like a Symphony pump. Assisted mother with pumping ~30 min. Discussed flange sizes. Mother aware that she may need to return to 15mm on L. Although fullness was still observed after pumping, mother reports of improved comfort. Discussed pumping intervals during the day versus night.   Objective Infant data: Mother's Current Feeding Choice: Breast Milk  Infant feeding assessment Scale for Readiness: 3 Scale for Quality: 3     Maternal data: [id number]  C-Section, Low Transverse No data recorded Current breast feeding challenges:: Engorgement   Pumping frequency: q2-3 during the day, possibly a little longer at night Pumped volume: 45 mL Flange Size: 24; 27 (Decreased to 24 on L side)    WIC Program: No (WIC eligible per mother) [facility] Referral Sent?: Yes Pump: DEBP, Personal (Motif Luna)  Assessment Infant: LATCH Score: 6   Maternal: Milk volume: Normal  Mother continues to have s/s of bilateral engorgement. Although it has improved over the past couple of day she remains at risk for mastitis and low milk supply.   Intervention/Plan Interventions: Breast feeding basics reviewed; Breast massage; Reverse pressure; Breast compression; Coconut oil; DEBP; Education  Tools: Pump; Flanges Pump Education: Setup, frequency, and cleaning; Milk Storage  Plan: Consult Status: Follow-up  Mother to continue to pump q2-3 during the day, no longer than 4 hrs over night.  Using 24 flange on L and 27 flange on R. Mother aware that she can increase to 27 on L PRN.  Lactation to send referral to  [facility]   NICU Follow-up type: Verify absence of engorgement; Weekly NICU follow up    [place] 05/15/2021, 12:42 PM

## 2021-05-16 ENCOUNTER — Ambulatory Visit: Payer: Self-pay

## 2021-05-16 NOTE — Lactation Note (Signed)
This note was copied from a baby's chart. Lactation Consultation Note  Patient Name: Melanie Cooley [id number] Date: 05/16/2021 Reason for consult: Follow-up assessment;Primapara;1st time breastfeeding;NICU baby;Mother's request;Infant < 6lbs;Late-preterm 34-36.6wks;Other (Comment) (IUGR. SGA, AMA) Age:38 days  Visited with mom of 90 66/61 weeks old (adjusted) NICU female, she reports that pumping is going well and that there is improvement on pain/discomfort on both breast; they felt soft upon examination; just a few knots that softened with breast massage.  Mom has been trying to take baby to breast, and reported that baby bit her. Reviewed normal LPI behavior, expectations, IDF 1, pumping schedule and lactogenesis II/III. Asked mom to call for latch assistance when needed.  Maternal Data  Mom's supply is slightly BNL  Feeding Mother's Current Feeding Choice: Breast Milk and Donor Milk  Lactation Tools Discussed/Used Tools: Pump;Flanges Flange Size: 24;27 Breast pump type: Double-Electric Breast Pump Pump Education: Setup, frequency, and cleaning;Milk Storage Reason for Pumping: LPI in NICU Pumping frequency: 7 times/24 hours Pumped volume: 30 mL (15-30 ml, it varies)  Interventions Interventions: Breast feeding basics reviewed;Breast massage;Hand express;Skin to skin;DEBP;Education;Ice  Plan of care Encouraged mom to continue pumping consistently every 2-3 hours, at least 8 pumping sessions/24 hours She'll continue working on breast massage and will ice her breast only if needed She'll start power pumping in the AM  No other support person at this time. All questions and concerns answered, mom to call NICU LC PRN.  Discharge Pump: DEBP;Personal (Motif [name])  Consult Status Consult Status: Follow-up Date: 05/16/21 Follow-up type: In-patient   Melanie Cooley 05/16/2021, 11:10 AM

## 2021-05-17 ENCOUNTER — Ambulatory Visit: Payer: Self-pay

## 2021-05-17 NOTE — Lactation Note (Signed)
This note was copied from a baby's chart.  NICU Lactation Consultation Note  Patient Name: Melanie Cooley [id number] Date: 05/17/2021 Age:38 days   Subjective Reason for consult: Follow-up assessment Engorgement has subsided. Mother is advancing bottles and offering breast. We reviewed feeding norms at 36 weeks. Questions answered regarding milk volume norms.   Objective Infant data: Mother's Current Feeding Choice: Breast Milk  Infant feeding assessment Scale for Readiness: 2 Scale for Quality: 2    Maternal data: [id number]  C-Section, Low Transverse  Pumping frequency: q3 Pumped volume: 30 mL Flange Size: 24; 27   WIC Program: No (WIC eligible per mother) [facility] Referral Sent?: Yes Pump: DEBP, Personal (Motif Luna)  Assessment Infant: LATCH Score: 6   Maternal: Milk volume: Normal   Intervention/Plan Interventions: Education  Tools: Pump; Flanges Pump Education: Setup, frequency, and cleaning; Milk Storage  Plan: Consult Status: Follow-up  NICU Follow-up type: Weekly NICU follow up; Assist with IDF-2 (Mother does not need to pre-pump before breastfeeding)  Mother to continue pumping q3 and offering breast when appropriate.  LC to return to assist prn.   [name] 05/17/2021, 12:24 PM

## 2021-05-17 NOTE — Lactation Note (Signed)
This note was copied from a baby's chart. Lactation Consultation Note LC returned at request of parents. FOB present and with questions/concerns about current milk volume. We reviewed pumping norms and strategies. We also reviewed IDF. All questions/concerns addressed.  Patient Name: Melanie Cooley [id number] Date: 05/17/2021 Reason for consult: Follow-up assessment Age:38 days  Feeding Mother's Current Feeding Choice: Breast Milk Nipple Type: Dr. Myra Gianotti Preemie   Lactation Tools Discussed/Used Pumping frequency: q3 Pumped volume: 30 mL  Interventions Interventions: Education  Consult Status Consult Status: Follow-up Date: 05/17/21 Follow-up type: In-patient   [name] 05/17/2021, 4:09 PM

## 2021-05-19 ENCOUNTER — Ambulatory Visit: Payer: Self-pay

## 2021-05-19 NOTE — Lactation Note (Signed)
This note was copied from a baby's chart. Lactation Consultation Note  Patient Name: Melanie Cooley UTMLY'Y Date: 05/19/2021 Reason for consult: Follow-up assessment;NICU baby;1st time breastfeeding;Primapara;Infant < 6lbs;Other (Comment);Early term 37-38.6wks (SGA, IUGR, AMA) Age:38 days  Visited with mom of 26 44/51 weeks old ETI NICU female, she reports that the engorgement episode is fully resolved and that the pain and swelling on her breast are gone now.  She's still concerned about her output, she's been pumping consistently, she tried power pumping once, but has not seen increase in her supply. Explained to mom that it will take at least 5-7 days to see results from such interventions, reviewed other strategies to increase supply such as power pumping and galactagogues.  Offered assistance with latch but mom said baby hasn't been cueing consistently and she's now working on bottle feedings. She plans to take baby to breast when he's ready though, asked her to call for assistance when needed. The last time she took baby to breast for some lick & learn was two days ago.  Maternal Data  Mom's supply is BNL  Feeding Mother's Current Feeding Choice: Breast Milk and Formula Nipple Type: Dr. Myra Gianotti Preemie  Lactation Tools Discussed/Used Tools: Pump;Flanges Flange Size: 24;27 Breast pump type: Double-Electric Breast Pump Pump Education: Setup, frequency, and cleaning;Milk Storage Reason for Pumping: ETI in NICU Pumping frequency: 7-8 times/24 hours Pumped volume: 30 mL  Interventions Interventions: Breast feeding basics reviewed;DEBP;Education  Plan of care Encouraged mom to continue pumping consistently every 2-3 hours, at least 8 pumping sessions/24 hours She'll start power pumping in the AM She'll start working on STS care around feeding times   No other support person at this time. All questions and concerns answered, mom to call NICU LC PRN.  Discharge Pump:  DEBP;Personal (Motif [name])  Consult Status Consult Status: Follow-up Date: 05/19/21 Follow-up type: In-patient   Melanie Cooley 05/19/2021, 10:49 AM

## 2021-05-21 ENCOUNTER — Inpatient Hospital Stay (HOSPITAL_COMMUNITY)
Admission: AD | Admit: 2021-05-21 | Discharge: 2021-05-21 | Disposition: A | Discharge status: Home / Self Care | Payer: Commercial Managed Care - HMO | Attending: Obstetrics & Gynecology | Admitting: Obstetrics & Gynecology

## 2021-05-21 ENCOUNTER — Other Ambulatory Visit: Payer: Self-pay

## 2021-05-21 DIAGNOSIS — O9089 Other complications of the puerperium, not elsewhere classified: Secondary | ICD-10-CM | POA: Diagnosis present

## 2021-05-21 DIAGNOSIS — R21 Rash and other nonspecific skin eruption: Secondary | ICD-10-CM | POA: Insufficient documentation

## 2021-05-21 MED ORDER — PREDNISONE 5 MG PO TABS: ORAL_TABLET | ORAL | 0 refills | Status: DC

## 2021-05-21 NOTE — MAU Note (Signed)
[name] is a 36 y.o. here in MAU reporting: started with a rash on her abdomen 5 days ago. Now has spread to arms and legs and legs are red and feeling like they are burning. Took claritin and benadryl this morning. Also using a cortisone cream.  Onset of complaint: ongoing  Pain score: 0/10  Vitals:   05/21/21 1317  BP: (!) 148/87  Pulse: (!) 116  Resp: 20  Temp: 98.8 F (37.1 C)  SpO2: 99%     Lab orders placed from triage: none

## 2021-05-21 NOTE — MAU Provider Note (Signed)
Event Date/Time   First Provider Initiated Contact with Patient 05/21/21 1336      S Ms. Malini Flemings is a 36 y.o. [id number] s/p pLTCS on 1/24 who presents to MAU today with complaint of rash. Patient reports rash started 5 days ago, itches, and has progressively worsened. She reports that rash started on her abdomen and then started to spread to arms and legs. She feels as if the rash is now starting on her neck. She reports that her legs feel very warm and as if they were on fire.  She has not taken any new meds, changed soaps/detergents, but feels like the rash may have started after starting lasix, however she completed the course several days ago and feels like the rash is even worse. No one in the household has the rash. She reports a history of eczema, but this is "100 times worse". She has no other symptoms. She has been alternating benadryl with claritin as well as using hydrocortisone cream without any relief. Has an appointment scheduled tomorrow, 2/6.    O BP (!) 148/87 (BP Location: Right Arm)    Pulse (!) 116    Temp 98.8 F (37.1 C) (Oral)    Resp 20    SpO2 99% Comment: room air   Breastfeeding Yes  Physical Exam Vitals and nursing note reviewed.  Constitutional:      General: She is not in acute distress. Eyes:     Extraocular Movements: Extraocular movements intact.     Pupils: Pupils are equal, round, and reactive to light.  Cardiovascular:     Rate and Rhythm: Tachycardia present.  Pulmonary:     Effort: Pulmonary effort is normal.  Musculoskeletal:     Cervical back: Normal range of motion.  Skin:    General: Skin is warm and dry.     Findings: Rash present.          Comments: Polymorphous, erythematous, excoriated, urticarial/papular/vesicular rash to multiple areas   Neurological:     Mental Status: She is alert.   A Medical screening exam complete Rash   P D/w Dr. Damita Dunnings who also evaluated patient. 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 Will treat with steroid taper.  Patient may continue to use benadryl/claritin as needed. Also recommend avoid scratching. May use ice packs and lotion or oatmeal bath to affected areas. Discharge from MAU in stable condition. Patient may return to MAU as needed. Keep appointment at [facility] as scheduled tomorrow 2/6     Renee Harder, [place] 05/21/2021 5:43 PM

## 2021-05-22 ENCOUNTER — Ambulatory Visit: Payer: Self-pay

## 2021-05-22 NOTE — Lactation Note (Signed)
This note was copied from a baby's chart. Lactation Consultation Note  Patient Name: Boy Virda Betters ZOXWR'U Date: 05/22/2021 Reason for consult: Follow-up assessment;Primapara;1st time breastfeeding Age:38 days  0945 - Lactation followed up with Ms. Wheelwright. A sign on her door indicated that she was pumping, and I gently knocked and introduced myself without entering. She invited me in and asked me to check an area on the right breast that was sore since yesterday. I noted that her tissue felt soft with no palpable areas of congestion. We reviewed what she learned about lymphatic breast massage, and she was able to repeat back the technique.   One change that might be playing a role in the breast change is that she has extended her sleeping interval to 5 hours at night. She pumps around 1200 and 5 am (approximately) and averages 7 pumping sessions a day. This is an important step for Ms. Pauls's overall well-being, and she is aware of the advice to have one overnight session. She is making an informed decision based on her overall plan of care.  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 She expressed that she is trying to avoid taking it because of potential impacts to her milk volume. To date, she has not taken the steroid.  Her mother was in the room at this consult. Her mother asked about the benefits of breast feeding to allergy reduction.  She had some follow up questions about power pumping, based on an earlier visit from an [place]. I provided some education on the purpose of power pumping, and we discussed options to do this that would work with her schedule.  1045 - I returned to the room to provide several covers to use for modesty while pumping. She thanked me. She was alone at this time. I invited her to speak with one of her lactation consultants or providers if she was feeling discouraged about pumping or potentially breast feeding. She indicated that expressing milk at  this time is important to her, even though she does not enjoy pumping, because of the benefits to baby. I recommended that she attend the breast feeding support group on Thursdays.  Maternal Data Has patient been taught Hand Expression?: Yes Does the patient have breastfeeding experience prior to this delivery?: No  Feeding Mother's Current Feeding Choice: Breast Milk Nipple Type: Dr. Myra Gianotti Preemie   Lactation Tools Discussed/Used Tools: Pump;Flanges Flange Size: 24 Breast pump type: Double-Electric Breast Pump Pump Education: Setup, frequency, and cleaning Reason for Pumping: NICU Pumping frequency: 7 times a day; one 5 hour break at night Pumped volume: 40 mL  Interventions Interventions: Breast feeding basics reviewed;Education  Discharge Pump: DEBP  Consult Status Consult Status: Follow-up Date: 05/22/21 Follow-up type: In-patient    Lenore Manner 05/22/2021, 11:00 AM

## 2021-05-23 ENCOUNTER — Telehealth (HOSPITAL_COMMUNITY): Payer: Self-pay | Admitting: *Deleted

## 2021-05-23 ENCOUNTER — Ambulatory Visit: Payer: Self-pay

## 2021-05-23 NOTE — Telephone Encounter (Signed)
Hospital Discharge Follow-Up Call:  Patient reports that she is well now, but had a few complications in first weeks after discharge that required follow-ups at both MD office and hospital: a rash under her incision attributed to allergic reaction to betadine, PUPS for which she is currently on steroids, increased BP, and constipation requiring her to come back to hospital for enema.   We were unable to complete the call at this time as she was in the NICU and it was her baby's feeding time.  She requested we call back in an hour.

## 2021-05-23 NOTE — Lactation Note (Signed)
This note was copied from a baby's chart.  NICU Lactation Consultation Note  Patient Name: Melanie Cooley DUKRC'V Date: 05/23/2021 Age:36 years   Subjective Reason for consult: Follow-up assessment Mother continues to pump q3. She has recent diagnosis of PUPPPs and is being treated with steroids. Mother has been challenging baby with bottle but limiting breastfeeding attempts because of her concern for prolonged or increased use of gavage if he does not bf well. We reviewed feeding norms. Mother would like to challenge at breast today. LC will plan return visit to assist.   Objective Infant data: Mother's Current Feeding Choice: Breast Milk  Infant feeding assessment Scale for Readiness: 2 Scale for Quality: 3    Maternal data: [id number]  C-Section, Low Transverse No data recorded Current breast feeding challenges:: recurrent areas of fullness/sore  Does the patient have breastfeeding experience prior to this delivery?: No  Pumping frequency: 30-50mL per pumping/ pumping q3 Pumped volume: 40 mL Flange Size: 24   WIC Program: No (WIC eligible per mother) [facility] Referral Sent?: Yes Pump: DEBP  Assessment Maternal: Milk volume: Normal   Intervention/Plan Interventions: Education; Infant Driven Feeding Algorithm education  Tools: Pump; Flanges Pump Education: Setup, frequency, and cleaning  Plan: Consult Status: Follow-up  NICU Follow-up type: Assist with IDF-2 (Mother does not need to pre-pump before breastfeeding)  LC to return at 5pm feeding to assist with bf'ing.  [name] 05/23/2021, 11:08 AM

## 2021-05-23 NOTE — Telephone Encounter (Signed)
Attempted to complete Hospital Discharge Follow-Up Call from earlier this afternoon.  Left voice mail requesting that patient return RN's call.

## 2021-05-25 ENCOUNTER — Ambulatory Visit: Payer: Self-pay

## 2021-05-25 NOTE — Lactation Note (Signed)
This note was copied from a baby's chart. Lactation Consultation Note  Patient Name: Melanie Cooley MLYYT'K Date: 05/25/2021 Reason for consult: Follow-up assessment;NICU baby;1st time breastfeeding;Primapara;Other (Comment);Early term 10-38.6wks;Infant weight loss;Mother's request (AMA, SGA) Age:38 wk.o.  Visited with mom of 82 75/22 weeks old NICU female, she's a P1 and requested a feeding assist. Mom voiced that baby has been going to breast using a NS # 20 but that she still needed some help with the latch.   LC took baby to the left breast in cross cradle hold and he latched and started sucking right away (see LATCH score). This LC also showed mom how to use and position the NS, she practiced after she was done with this 6 minutes feeding; total time at the breast was 9 minutes with some breastmilk noted on NS # 20.  Maternal Data  Mom's supply is BNL but it seems to start increasing slowly due to power pumping she reports she has started leaking now in between pumping sessions  Feeding Mother's Current Feeding Choice: Breast Milk and Formula Nipple Type: Dr. Myra Gianotti Preemie  LATCH Score Latch: Repeated attempts needed to sustain latch, nipple held in mouth throughout feeding, stimulation needed to elicit sucking reflex. (baby actively cueing in the beginning of the feeding but fatigues easily, used NS # 20)  Audible Swallowing: A few with stimulation (with breast compressions only), noticed both patterns, NS and NNS  Type of Nipple: Everted at rest and after stimulation  Comfort (Breast/Nipple): Soft / non-tender  Hold (Positioning): Assistance needed to correctly position infant at breast and maintain latch.  LATCH Score: 7  Lactation Tools Discussed/Used Tools: Pump;Flanges;Nipple Shields Nipple shield size: 20 Flange Size: 24 Breast pump type: Double-Electric Breast Pump Pump Education: Setup, frequency, and cleaning;Milk Storage Reason for Pumping: ETI in  NICU Pumping frequency: 7 times/24 hours Pumped volume: 30 mL (30-45 ml)  Interventions Interventions: Breast feeding basics reviewed;Assisted with latch;Skin to skin;Breast compression;Support pillows;DEBP;Education  Plan of care Encouraged mom to continue pumping consistently every 2-3 hours, at least 8 pumping sessions/24 hours. She understands that if she starts leaking at the 2 hours mark, it's time to pump. She'll continue working on STS care around feeding times She'll continue taking baby to breast on feeding cues, using NS # 20 PRN. Priming the NS with breastmilk/formula seems to help baby having a better start   GOB (maternal) present and supportive. All questions and concerns answered, mom to call NICU LC PRN.  Discharge Pump: DEBP;Personal (Motif [name])  Consult Status Consult Status: Follow-up Date: 05/25/21 Follow-up type: In-patient   Makinzey Banes Francene Boyers 05/25/2021, 12:49 PM

## 2021-05-26 ENCOUNTER — Ambulatory Visit: Payer: Self-pay

## 2021-05-26 NOTE — Lactation Note (Signed)
This note was copied from a baby's chart. Lactation Consultation Note  Patient Name: Melanie Cooley [id number] Date: 05/26/2021 Reason for consult: Follow-up assessment;NICU baby;1st time breastfeeding;Primapara;Other (Comment);Early term 37-38.6wks (AMA, IUGR, SGA) Age:38 wk.o.  Mom called out for a feeding assist, when [place] and LC studen Glodean came in the room baby was still sleep but woke up after his cares. Practiced with mom and dad NS placement, FOB primed NS with some EBM prior latching but baby kept fussing at the breast, he latched on briefly a couple of times and did a few sucks on a off before falling asleep within a minute (see LATCH score) an attempt was documented in Flowsheets.  Mom asked LC to call her RN to get baby's gavage feeding started but RN Karle Starch came in the room with a bottle and asked mom to give it a try. Will check with SLPs for current recommendation for baby "Melanie Cooley"; this time the feeding was gavaged per mom's request.   Maternal Data  Mom's supply stays stable and is BNL  Feeding Mother's Current Feeding Choice: Breast Milk and Formula Nipple Type: Dr. Myra Gianotti Preemie  LATCH Score Latch: Repeated attempts needed to sustain latch, nipple held in mouth throughout feeding, stimulation needed to elicit sucking reflex. (only a few sucks, baby irritable and fell asleep after this attempt)  Audible Swallowing: None (he did drink the EBM that we used to prime the NS, only a couple of swallows noted due to that fact)  Type of Nipple: Everted at rest and after stimulation  Comfort (Breast/Nipple): Soft / non-tender  Hold (Positioning): Assistance needed to correctly position infant at breast and maintain latch.  LATCH Score: 6  Lactation Tools Discussed/Used Tools: Pump;Flanges;Nipple Shields Nipple shield size: 20 Flange Size: 24 Breast pump type: Double-Electric Breast Pump Pump Education: Setup, frequency, and cleaning;Milk Storage Reason for  Pumping: ETI in NICU Pumping frequency: 7 times/24 hours Pumped volume: 30 mL (up to 45 ml with power pumping)  Interventions Interventions: Assisted with latch;Skin to skin;Breast massage;Hand express;DEBP;Education;Breast compression;Adjust position;Support pillows  Plan of care Encouraged mom to continue pumping consistently every 2-3 hours, at least 8 pumping sessions/24 hours. She understands that if she starts leaking at the 2 hours mark, it's time to pump. She'll continue power pumping once/day She'll continue taking baby to breast on feeding cues, using NS # 20 PRN. Priming the NS with breastmilk/formula seems to help baby having a better start   FOB present and supportive. All questions and concerns answered, mom to call NICU LC PRN.  Discharge Pump: DEBP;Personal (Melanie Cooley)  Consult Status Consult Status: Follow-up Date: 05/26/21 Follow-up type: In-patient   Moishy Laday Francene Boyers 05/26/2021, 3:47 PM

## 2021-05-29 ENCOUNTER — Ambulatory Visit: Payer: Self-pay

## 2021-05-29 NOTE — Lactation Note (Signed)
This note was copied from a baby's chart.  NICU Lactation Consultation Note  Patient Name: Melanie Cooley SFSEL'T Date: 05/29/2021 Age:36   Subjective Reason for consult: Follow-up assessment; Other (Comment) (infant d/c) Mother continues to practice bf'ing with infant. She pumps frequently and with increasing volumes. She continues to have occasional plugged ducts on the R breast. We reviewed strategies to decrease incidence. Mother requests outpatient apt and community resources, both of which were discussed.   Objective Infant data: Mother's Current Feeding Choice: Breast Milk and Formula  Infant feeding assessment Scale for Readiness: 2 Scale for Quality: 2    Maternal data: [id number]  C-Section, Low Transverse  Pumping frequency: q3 Pumped volume: 60 mL   WIC Program: No (WIC eligible per mother) [facility] Referral Sent?: Yes Pump: DEBP, Personal (Motif [place])  Assessment Infant: No data recorded Feeding Status: Ad lib   Maternal: Milk volume: Normal   Intervention/Plan Interventions: Education  Tools: Bottle  Plan: Consult Status: Complete Referral sent to S. [facility], IBCLC  [name] 05/29/2021, 10:40 AM

## 2021-06-07 ENCOUNTER — Other Ambulatory Visit: Payer: Self-pay | Admitting: Family Medicine

## 2021-06-07 DIAGNOSIS — C641 Malignant neoplasm of right kidney, except renal pelvis: Secondary | ICD-10-CM

## 2021-06-09 ENCOUNTER — Inpatient Hospital Stay (HOSPITAL_COMMUNITY): Admit: 2021-06-09 | Payer: Managed Care, Other (non HMO) | Admitting: Obstetrics and Gynecology

## 2021-06-17 ENCOUNTER — Encounter (HOSPITAL_COMMUNITY): Payer: Self-pay | Admitting: Emergency Medicine

## 2021-06-17 ENCOUNTER — Emergency Department (HOSPITAL_COMMUNITY)
Admission: EM | Admit: 2021-06-17 | Discharge: 2021-06-17 | Disposition: A | Discharge status: Home / Self Care | Payer: Commercial Managed Care - HMO | Attending: Emergency Medicine | Admitting: Emergency Medicine

## 2021-06-17 ENCOUNTER — Emergency Department (HOSPITAL_COMMUNITY): Payer: Commercial Managed Care - HMO

## 2021-06-17 ENCOUNTER — Other Ambulatory Visit: Payer: Self-pay

## 2021-06-17 DIAGNOSIS — I493 Ventricular premature depolarization: Secondary | ICD-10-CM | POA: Insufficient documentation

## 2021-06-17 DIAGNOSIS — O9943 Diseases of the circulatory system complicating the puerperium: Secondary | ICD-10-CM | POA: Insufficient documentation

## 2021-06-17 DIAGNOSIS — R002 Palpitations: Secondary | ICD-10-CM

## 2021-06-17 LAB — CBC
HCT: 36.1 % (ref 36.0–46.0)
Hemoglobin: 11.8 g/dL — ABNORMAL LOW (ref 12.0–15.0)
MCH: 29 pg (ref 26.0–34.0)
MCHC: 32.7 g/dL (ref 30.0–36.0)
MCV: 88.7 fL (ref 80.0–100.0)
Platelets: 377 10*3/uL (ref 150–400)
RBC: 4.07 MIL/uL (ref 3.87–5.11)
RDW: 12.2 % (ref 11.5–15.5)
WBC: 5.5 10*3/uL (ref 4.0–10.5)
nRBC: 0 % (ref 0.0–0.2)

## 2021-06-17 LAB — BASIC METABOLIC PANEL
Anion gap: 11 (ref 5–15)
BUN: 17 mg/dL (ref 6–20)
CO2: 25 mmol/L (ref 22–32)
Calcium: 9.5 mg/dL (ref 8.9–10.3)
Chloride: 99 mmol/L (ref 98–111)
Creatinine, Ser: 0.69 mg/dL (ref 0.44–1.00)
GFR, Estimated: >60 mL/min (ref >60)
Glucose, Bld: 93 mg/dL (ref 70–99)
Potassium: 4.2 mmol/L (ref 3.5–5.1)
Sodium: 135 mmol/L (ref 135–145)

## 2021-06-17 LAB — I-STAT BETA HCG BLOOD, ED (MC, WL, AP ONLY): I-stat hCG, quantitative: <5 m[IU]/mL (ref <5)

## 2021-06-17 LAB — TROPONIN I (HIGH SENSITIVITY): Troponin I (High Sensitivity): 3 ng/L (ref <18)

## 2021-06-17 LAB — MAGNESIUM: Magnesium: 1.9 mg/dL (ref 1.7–2.4)

## 2021-06-17 NOTE — ED Triage Notes (Signed)
Pt reports 5 weeks postpartum, has been experiencing heart palpitations. Reports dizziness from time.   ?

## 2021-06-17 NOTE — Discharge Instructions (Addendum)
I recommended that you restart your labetalol to 50 mg twice a day as you were on during your taper.  You may be experiencing some rebound or side effects of coming off of this medication after your pregnancy.  I placed a referral to a cardiology group to arrange for an office follow-up.  If you do not hear from them in 2 business days call the number above. ? ?Try to avoid caffeine, nicotine, and alcohol, which can also irritate your heart. ?

## 2021-06-17 NOTE — ED Provider Notes (Signed)
?[facility] ?Provider Note ? ? ?CSN: [id number] ?Arrival date & time: 06/17/21  1848 ? ?  ? ?History ? ?Chief Complaint  ?Patient presents with  ? Palpitations  ? ? ?Melanie Cooley is a 36 y.o. female who is 5 weeks postpartum presenting to ED with palpitations.  The patient reports that she suffered an preeclampsia while she was pregnant she was on labetalol 100 mg daily as well as Procardia.  She was subsequently weaned off the labetalol by her OB/GYN 5 days ago, reports she has had increasing palpitations since then.  She drinks half a cup of caffeine a day, no more.  No alcohol.  No smoking.  No history of arrhythmias or family history of sudden death or significant cardiac disease.  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 ? ?She comes to the ED today because she was having episodes of both palpitations intermittently the past few days, feels she occasionally has skipped heartbeat ? ?HPI ? ?  ? ?Home Medications ?Prior to Admission medications   ?Medication Sig Start Date End Date Taking? Authorizing Provider  ?albuterol (VENTOLIN HFA) 108 (90 Base) MCG/ACT inhaler Inhale into the lungs every 6 (six) hours as needed for wheezing or shortness of breath.    [provider]  ?ferrous sulfate 324 MG TBEC Take 1 tablet (324 mg total) by mouth daily with breakfast. 05/11/21   Murrell Redden Earlyne Iba, MD  ?furosemide (LASIX) 20 MG tablet Take 20 mg by mouth.    [provider]  ?ibuprofen (ADVIL) 600 MG tablet Take 1 tablet (600 mg total) by mouth every 6 (six) hours. 05/11/21   Charyl Bigger, MD  ?labetalol (NORMODYNE) 100 MG tablet Take 100 mg by mouth 2 (two) times daily.    [provider]  ?magnesium gluconate (MAGONATE) 500 MG tablet Take 500 mg by mouth daily.    [provider]  ?NIFEdipine (ADALAT CC) 30 MG 24 hr tablet Take 1 tablet (30 mg total) by mouth daily. 05/12/21    Charyl Bigger, MD  ?oxyCODONE (OXY IR/ROXICODONE) 5 MG immediate release tablet Take 1 tablet (5 mg total) by mouth every 6 (six) hours as needed for moderate pain. 05/11/21   Charyl Bigger, MD  ?predniSONE (DELTASONE) 5 MG tablet Day 1: Take 2 tablets with breakfast, 1 tablet with lunch, 1 tablet with dinner, 2 tablets at bedtime Day 2: Take 1 tablet with breakfast, 1 tablet with lunch, 1 tablet with dinner, 2 tablets at bedtime Day 3: Take 1 tablet with breakfast, 1 tablet with lunch, 1 tablet with dinner, 1 tablet at bedtime Day 4: Take 1 tablet with breakfast, 1 tablet with lunch, 1 tablet at bedtime Day 5: Take 1 tablet with breakfast, 1 tablet at bedtime Day 6: Take 1 tablet with breakfast 05/21/21   Renee Harder, CNM  ?Prenatal Vit-Fe Fumarate-FA (PRENATAL MULTIVITAMIN) TABS tablet Take 1 tablet by mouth daily at 12 noon.    [provider]  ?   ? ?Allergies    ?Compazine [prochlorperazine], Tape, and Betadine [povidone-iodine]   ? ?Review of Systems   ?Review of Systems ? ?Physical Exam ?Updated Vital Signs ?BP 129/86   Pulse 100   Temp 98.5 ?F (36.9 ?C) (Oral)   Resp 17   Ht '5\' 6"'$  (1.676 m)   Wt 83.9 kg   SpO2 99%   BMI 29.86 kg/m?  ?Physical Exam ?Constitutional:   ?  General: She is not in acute distress. ?HENT:  ?   Head: Normocephalic and atraumatic.  ?Eyes:  ?   Conjunctiva/sclera: Conjunctivae normal.  ?   Pupils: Pupils are equal, round, and reactive to light.  ?Cardiovascular:  ?   Rate and Rhythm: Normal rate and regular rhythm.  ?   Comments: Occasional PVC ?Pulmonary:  ?   Effort: Pulmonary effort is normal. No respiratory distress.  ?Abdominal:  ?   General: There is no distension.  ?   Tenderness: There is no abdominal tenderness.  ?Skin: ?   General: Skin is warm and dry.  ?Neurological:  ?   General: No focal deficit present.  ?   Mental Status: She is alert. Mental status is at baseline.  ?Psychiatric:     ?   Mood and Affect: Mood normal.     ?   Behavior:  Behavior normal.  ? ? ?ED Results / Procedures / Treatments   ?Labs ?(all labs ordered are listed, but only abnormal results are displayed) ?Labs Reviewed  ?CBC - Abnormal; Notable for the following components:  ?    Result Value  ? Hemoglobin 11.8 (*)   ? All other components within normal limits  ?BASIC METABOLIC PANEL  ?MAGNESIUM  ?I-STAT BETA HCG BLOOD, ED ([facility], [facility], [facility] ONLY)  ?TROPONIN I (HIGH SENSITIVITY)  ? ? ?EKG ?EKG Interpretation ? ?Date/Time:  Saturday June 17 2021 18:56:54 EST ?Ventricular Rate:  95 ?PR Interval:  122 ?QRS Duration: 86 ?QT Interval:  348 ?QTC Calculation: 437 ?R Axis:   85 ?Text Interpretation: Normal sinus rhythm Normal ECG When compared with ECG of 23-Apr-2021 11:31, PREVIOUS ECG IS PRESENT Confirmed by Octaviano Glow [phone]) on 06/17/2021 8:14:36 PM ? ?Radiology ?DG Chest 2 View ? ?Result Date: 06/17/2021 ?CLINICAL DATA:  Cardiac palpitations for several days EXAM: CHEST - 2 VIEW COMPARISON:  None. FINDINGS: The heart size and mediastinal contours are within normal limits. Both lungs are clear. The visualized skeletal structures are unremarkable. IMPRESSION: No active cardiopulmonary disease. Electronically Signed   By: Inez Catalina M.D.   On: 06/17/2021 20:24   ? ?Procedures ?Procedures  ? ? ?Medications Ordered in ED ?Medications - No data to display ? ?ED Course/ Medical Decision Making/ A&P ?  ?                        ?Medical Decision Making ?Amount and/or Complexity of Data Reviewed ?Labs: ordered. ?Radiology: ordered. ? ? ?Patient is here with palpitations, EKG per my interpretation shows normal sinus rhythm without acute arrhythmia or ischemia.  I suspect he may be experiencing withdrawal symptoms from labetalol, rebound tachycardia, and in the setting of her Procardia use.  I would advise that she go back to her low-dose 50 mg twice a day labetalol for now, which she was using as a wean, and we will place a referral to cardiology to see if she could be fitted for a Holter  monitor or Zio patch or further cardiac monitoring. ? ?Otherwise I reviewed her labs and imaging today do not see any evidence of life-threatening emergency.  Her electrolytes are within normal limits and x-ray does not show any evidence of infiltrate.  Blood pressure is normal here, she does not have evidence of severe eclampsia. ? ?I have a low suspicion for pulmonary embolism with no hypoxia, with her symptoms being intermittent. ? ? ? ? ? ? ? ?Final Clinical Impression(s) / ED Diagnoses ?Final diagnoses:  ?PVC (  premature ventricular contraction)  ?Heart palpitations  ? ? ?Rx / DC Orders ?ED Discharge Orders   ? ?      Ordered  ?  Ambulatory referral to Cardiology       ?Comments: SVT vs PVC's post-partum, recommending office evaluation for possible zio/cardiac monitoring  ? 06/17/21 2130  ? ?  ?  ? ?  ? ? ?  ?Wyvonnia Dusky, MD ?06/18/21 1015 ? ?

## 2021-07-08 ENCOUNTER — Ambulatory Visit
Admission: RE | Admit: 2021-07-08 | Discharge: 2021-07-08 | Disposition: A | Discharge status: Home / Self Care | Payer: Medicaid Other | Source: Ambulatory Visit | Attending: Family Medicine | Admitting: Family Medicine

## 2021-07-08 ENCOUNTER — Other Ambulatory Visit: Payer: Self-pay

## 2021-07-08 DIAGNOSIS — C641 Malignant neoplasm of right kidney, except renal pelvis: Secondary | ICD-10-CM

## 2021-07-08 IMAGING — MR MR ABDOMEN WO/W CM
12 of 17 series · 26 of 48 positions shown · IV contrast (15 ml multihance)
Comparison: Multiple priors including most recent MRI [DATE]

CLINICAL DATA: History of renal cell carcinoma status post partial
right nephrectomy.

EXAM:
MRI ABDOMEN WITHOUT AND WITH CONTRAST
TECHNIQUE: Multiplanar multisequence MR imaging of the abdomen was performed
both before and after the administration of intravenous contrast.
CONTRAST:  15mL MULTIHANCE GADOBENATE DIMEGLUMINE 529 MG/ML IV SOLN

[Series 3: T2 · coronal · 5.0mm · 1.56mm/px · 1 of 30 slices shown (1 of 3)]
[im 1/30]
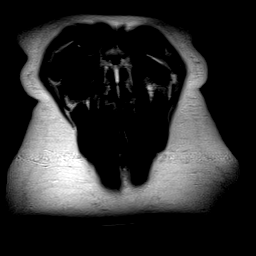

[Series 4: axial tru fisp · axial · 5.0mm · 1.41mm/px · 1 of 37 slices shown]
[im 1/37]
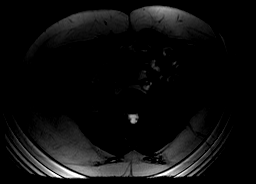

[Series 5: T2 · axial · 6.5mm · 0.74mm/px · 1 of 30 slices shown (2 of 3)]
[im 1/30]
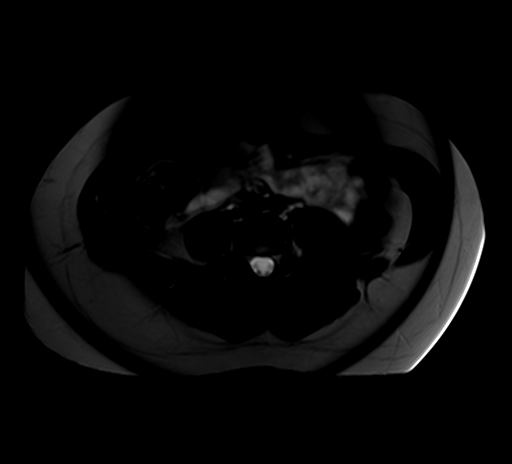

[Series 6: ep2d_diff_b50_500_800_p2 · axial · 6.0mm · 1.98mm/px · z∈[-97,+129]mm · 3 of 90 slices shown]
[im 1/90]
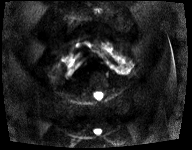
[im 45/90]
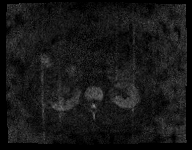
[im 90/90]
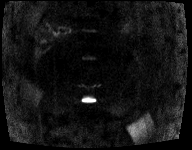

[Series 7: ep2d_diff_b50_500_800_p2_adc · axial · 6.0mm · 1.98mm/px · 1 of 30 slices shown]
[im 1/30]
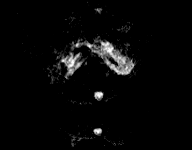

[Series 8: T2 · axial · 5.0mm · 1.37mm/px · 1 of 35 slices shown (3 of 3)]
[im 1/35]
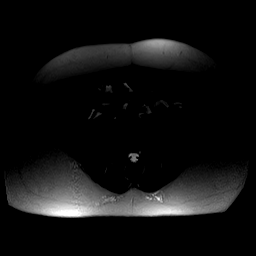

[Series 9: axial in out · axial · 6.0mm · 0.74mm/px · 1 of 60 slices shown]
[im 1/60]
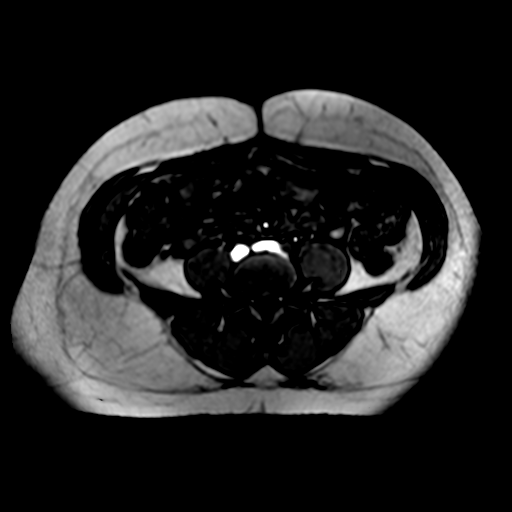

[Series 10: T1 dynamic · axial · non-contrast · 2.2mm · 0.78mm/px · z∈[-105,+86]mm · 4 of 88 slices shown]
[im 1/88]
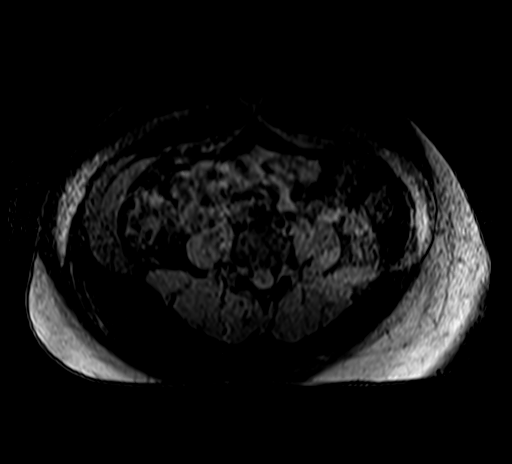
[im 30/88]
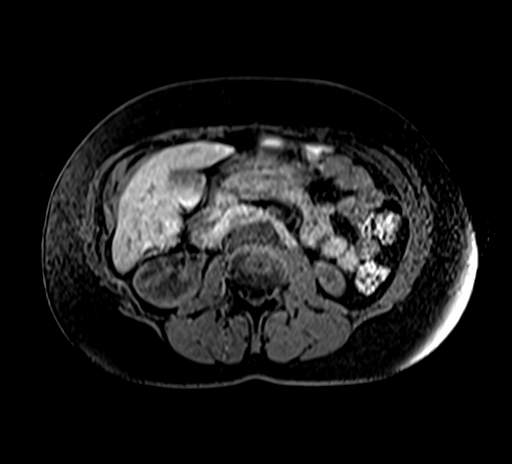
[im 59/88]
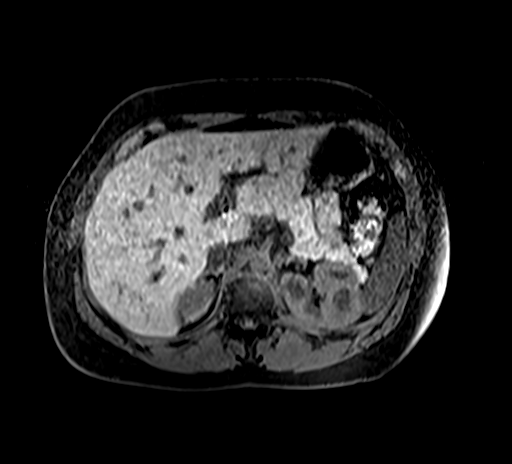
[im 88/88]
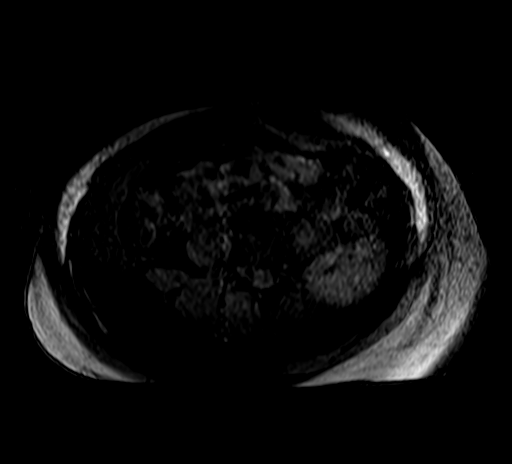

[Series 11: post 25 sec · axial · 2.2mm · 0.78mm/px · z∈[-105,+86]mm · 4 of 88 slices shown]
[im 1/88]
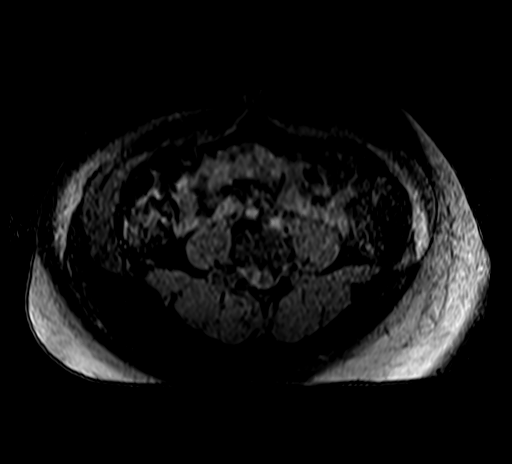
[im 30/88]
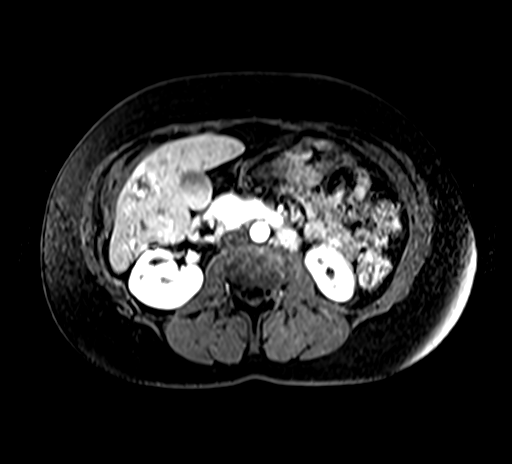
[im 59/88]
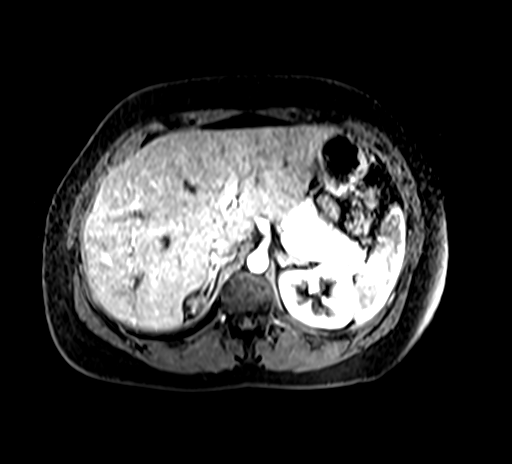
[im 88/88]
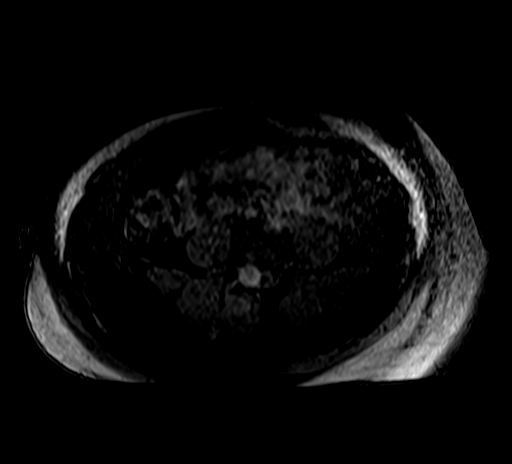

[Series 12: post 25 sec_sub · axial · 2.2mm · 0.78mm/px · z∈[-105,+86]mm · 4 of 88 slices shown]
[im 1/88]
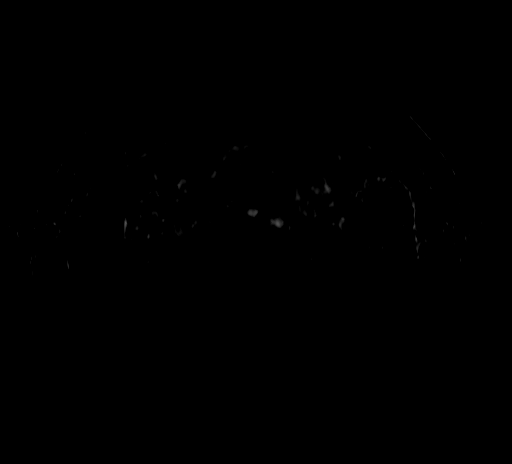
[im 30/88]
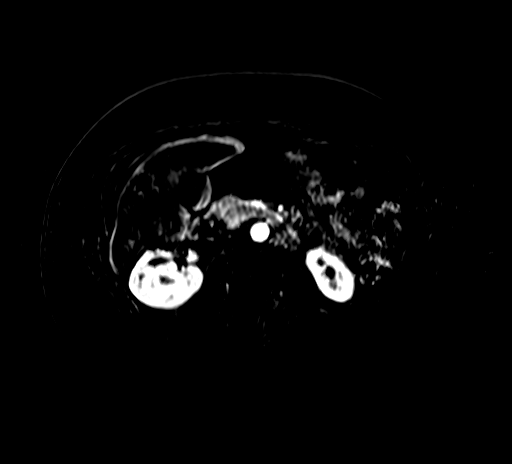
[im 59/88]
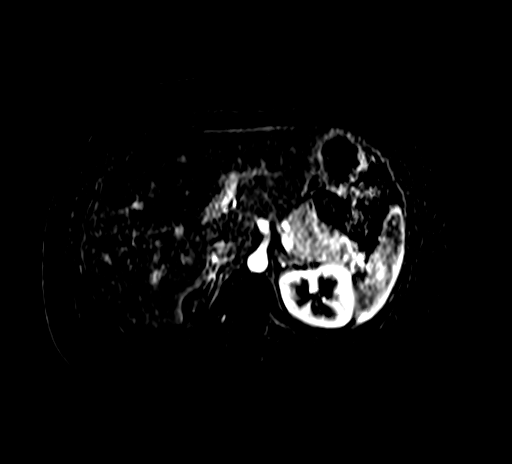
[im 88/88]
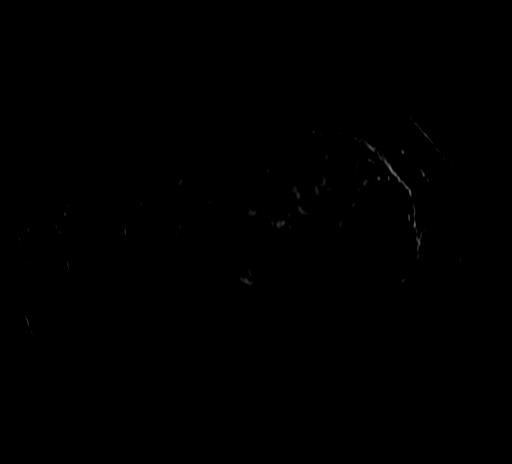

[Series 13: post 45 sec · axial · 2.2mm · 0.78mm/px · z∈[-105,+86]mm · 4 of 88 slices shown]
[im 1/88]
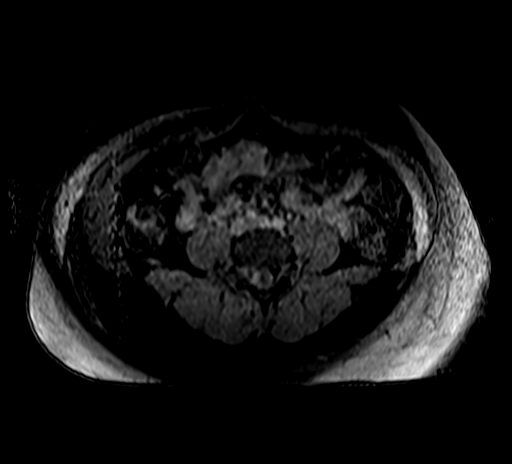
[im 30/88]
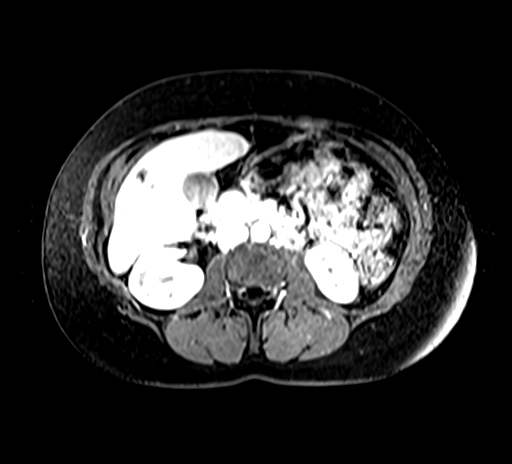
[im 59/88]
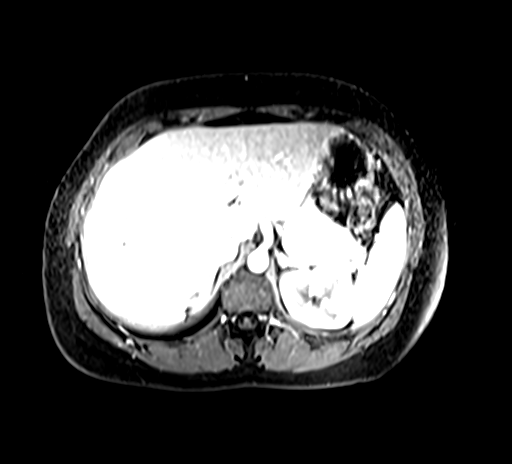
[im 88/88]
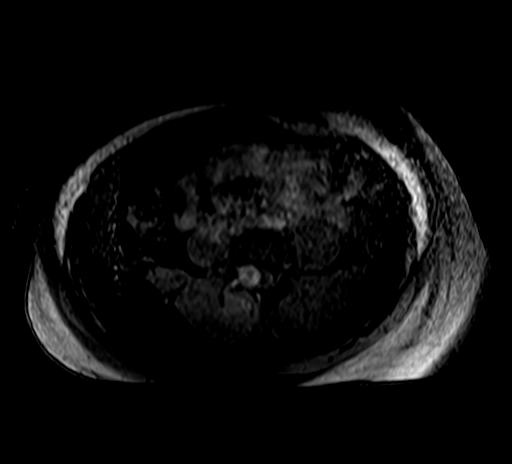

[Series 14: post 45 sec_sub · axial · 2.2mm · 0.78mm/px · 1 of 88 slices shown]
[im 1/88]
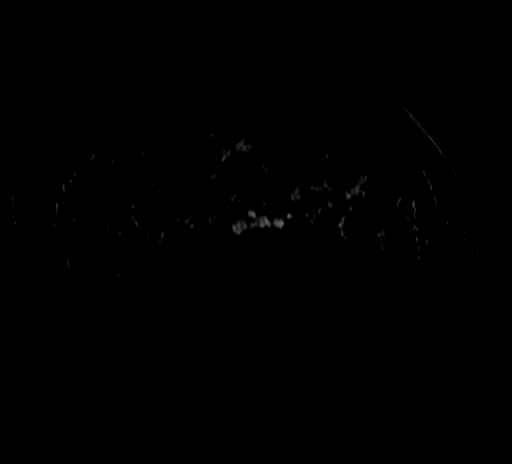

[26 of 48 positions shown; findings below may reference images not displayed]

FINDINGS: Lower chest: No acute abnormality.

Hepatobiliary: Stable benign 2.7 cm segment V hepatic hemangioma on
image 58/13. No suspicious hepatic lesions. Gallbladder is
unremarkable. No biliary ductal dilation.

Pancreas: Intrinsic T1 signal of the pancreatic parenchyma is within
normal limits. No pancreatic ductal dilation. Homogeneous
enhancement of the pancreas postcontrast administration. No cystic
or solid hyperenhancing pancreatic lesions identified.

Spleen:  No splenomegaly or focal splenic lesion.

Adrenals/Urinary Tract: Stable 17 mm right adrenal nodule which
demonstrates loss of signal on out of phase imaging and most
consistent with a benign adrenal adenoma. Left adrenal gland appears
normal.

Similar postsurgical changes of partial right nephrectomy without
suspicious enhancing soft tissue nodularity in the nephrectomy bed
to suggest local recurrence.

No hydronephrosis.  No solid enhancing renal mass.

Stomach/Bowel: Visualized portions within the abdomen are
unremarkable.

Vascular/Lymphatic: No pathologically enlarged lymph nodes
identified. No abdominal aortic aneurysm demonstrated.

Other:  No significant abdominopelvic free fluid.

Musculoskeletal: No suspicious bone lesions identified.
IMPRESSION: 1. Similar postsurgical changes of partial right nephrectomy without
evidence of local recurrence or metastatic disease.
2. Stable right adrenal nodule which is most consistent with a
benign adrenal adenoma.

## 2021-07-08 MED ORDER — GADOBENATE DIMEGLUMINE 529 MG/ML IV SOLN
15.0000 mL | Freq: Once | INTRAVENOUS | Status: AC | PRN
  Administered 2021-07-08: 15 mL via INTRAVENOUS

## 2021-07-17 ENCOUNTER — Ambulatory Visit (HOSPITAL_BASED_OUTPATIENT_CLINIC_OR_DEPARTMENT_OTHER): Payer: Medicaid Other | Admitting: Cardiology

## 2021-07-17 NOTE — Progress Notes (Incomplete)
?Cardiology Office Note:   ? ?Date:  07/17/2021  ? ?ID:  Melanie Cooley, DOB 03/30/1986, MRN [id number] ? ?PCP:  Marda Stalker, PA-C  ?Cardiologist:  None ? ?Referring MD: Wyvonnia Dusky, MD  ? ?No chief complaint on file. ? ? ?History of Present Illness:   ? ?Melanie Cooley is a 36 y.o. female with a hx of preeclampsia, renal cell carcinoma, and depression, who is seen as a new consult at the request of Trifan, Carola Rhine, MD for the evaluation and management of PVCs ? ?She was seen in the ED 06/17/2021 for palpitations. She had preeclampsia while pregnant, on labetalol 100 mg daily and Procardia. Subsequently she was weaned off labetalol and had increasing palpitations since then. It was thought her symptoms were due to experiencing withdrawal from labetalol, rebound tachycardia, and in the setting of Procardia use. She was advised to go back to her 50 mg dose of labetalol, and she was referred to cardiology for consideration of event monitor. ? ?Cardiovascular risk factors: ?Prior clinical ASCVD:  ?Comorbid conditions: hypertension, hyperlipidemia, diabetes, chronic kidney disease  ?Metabolic syndrome/Obesity: ?Chronic inflammatory conditions: ?Tobacco use history: ?Family history: ?Prior pertinent testing and/or incidental findings: ?Exercise level: ?Current diet: ? ?She denies any palpitations, chest pain, shortness of breath, or peripheral edema. No lightheadedness, headaches, syncope, orthopnea, or PND. ? ? ?. ? ?Past Medical History:  ?Diagnosis Date  ? Anxiety   ? Depression   ? Preeclampsia 2023  ? Renal cell carcinoma ([place]) 2017  ? ? ?Past Surgical History:  ?Procedure Laterality Date  ? CESAREAN SECTION N/A 05/09/2021  ? Procedure: Primary CESAREAN SECTION;  Surgeon: Azucena Fallen, MD;  Location: [facility];  Service: Obstetrics;  Laterality: N/A;  EDD: 06/09/21  ? INDUCED ABORTION    ? NEPHRECTOMY Right 2017  ? Partial due to cancer  ? ? ?Current Medications: ?Current Outpatient Medications on File Prior  to Visit  ?Medication Sig  ? albuterol (VENTOLIN HFA) 108 (90 Base) MCG/ACT inhaler Inhale into the lungs every 6 (six) hours as needed for wheezing or shortness of breath.  ? ferrous sulfate 324 MG TBEC Take 1 tablet (324 mg total) by mouth daily with breakfast.  ? furosemide (LASIX) 20 MG tablet Take 20 mg by mouth.  ? ibuprofen (ADVIL) 600 MG tablet Take 1 tablet (600 mg total) by mouth every 6 (six) hours.  ? labetalol (NORMODYNE) 100 MG tablet Take 100 mg by mouth 2 (two) times daily.  ? magnesium gluconate (MAGONATE) 500 MG tablet Take 500 mg by mouth daily.  ? NIFEdipine (ADALAT CC) 30 MG 24 hr tablet Take 1 tablet (30 mg total) by mouth daily.  ? oxyCODONE (OXY IR/ROXICODONE) 5 MG immediate release tablet Take 1 tablet (5 mg total) by mouth every 6 (six) hours as needed for moderate pain.  ? predniSONE (DELTASONE) 5 MG tablet Day 1: Take 2 tablets with breakfast, 1 tablet with lunch, 1 tablet with dinner, 2 tablets at bedtime Day 2: Take 1 tablet with breakfast, 1 tablet with lunch, 1 tablet with dinner, 2 tablets at bedtime Day 3: Take 1 tablet with breakfast, 1 tablet with lunch, 1 tablet with dinner, 1 tablet at bedtime Day 4: Take 1 tablet with breakfast, 1 tablet with lunch, 1 tablet at bedtime Day 5: Take 1 tablet with breakfast, 1 tablet at bedtime Day 6: Take 1 tablet with breakfast  ? Prenatal Vit-Fe Fumarate-FA (PRENATAL MULTIVITAMIN) TABS tablet Take 1 tablet by mouth daily at 12 noon.  ? ?No  current facility-administered medications on file prior to visit.  ?  ? ?Allergies:   Compazine [prochlorperazine], Tape, and Betadine [povidone-iodine]  ? ?Social History  ? ?Tobacco Use  ? Smoking status: Never  ? Smokeless tobacco: Never  ?Vaping Use  ? Vaping Use: Never used  ?Substance Use Topics  ? Alcohol use: Never  ? Drug use: Never  ? ? ?Family History: ?family history includes Cancer in her maternal grandfather and maternal grandmother; Hyperlipidemia in her father; Mesothelioma in her paternal  grandmother. ? ?ROS:   ?Please see the history of present illness.  Additional pertinent ROS: ?Constitutional: Negative for chills, fever, night sweats, unintentional weight loss  ?HENT: Negative for ear pain and hearing loss.   ?Eyes: Negative for loss of vision and eye pain.  ?Respiratory: Negative for cough, sputum, wheezing.   ?Cardiovascular: See HPI. ?Gastrointestinal: Negative for abdominal pain, melena, and hematochezia.  ?Genitourinary: Negative for dysuria and hematuria.  ?Musculoskeletal: Negative for falls and myalgias.  ?Skin: Negative for itching and rash.  ?Neurological: Negative for focal weakness, focal sensory changes and loss of consciousness.  ?Endo/Heme/Allergies: Does not bruise/bleed easily.   ? ? ?EKGs/Labs/Other Studies Reviewed:   ? ?The following studies were reviewed today: ? ?No prior cardiovascular studies available. ? ? ?EKG:  EKG is personally reviewed.   ?07/17/2021: *** ? ?Recent Labs: ?05/11/2021: ALT 29 ?06/17/2021: BUN 17; Creatinine, Ser 0.69; Hemoglobin 11.8; Magnesium 1.9; Platelets 377; Potassium 4.2; Sodium 135  ?Recent Lipid Panel ?No results found for: CHOL, TRIG, HDL, CHOLHDL, VLDL, LDLCALC, LDLDIRECT ? ?Physical Exam:   ? ?VS:  There were no vitals taken for this visit.   ? ?Wt Readings from Last 3 Encounters:  ?06/17/21 185 lb (83.9 kg)  ?05/12/21 200 lb (90.7 kg)  ?05/11/21 200 lb 4 oz (90.8 kg)  ?  ?GEN: Well nourished, well developed in no acute distress ?HEENT: Normal, moist mucous membranes ?NECK: No JVD ?CARDIAC: regular rhythm, normal S1 and S2, no rubs or gallops. No murmur. ?VASCULAR: Radial and DP pulses 2+ bilaterally. No carotid bruits ?RESPIRATORY:  Clear to auscultation without rales, wheezing or rhonchi  ?ABDOMEN: Soft, non-tender, non-distended ?MUSCULOSKELETAL:  Ambulates independently ?SKIN: Warm and dry, no edema ?NEUROLOGIC:  Alert and oriented x 3. No focal neuro deficits noted. ?PSYCHIATRIC:  Normal affect   ? ?ASSESSMENT:   ? ?No diagnosis  found. ?PLAN:   ? ? ?Cardiac risk counseling and prevention recommendations: ?-recommend heart healthy/Mediterranean diet, with whole grains, fruits, vegetable, fish, lean meats, nuts, and olive oil. Limit salt. ?-recommend moderate walking, 3-5 times/week for 30-50 minutes each session. Aim for at least 150 minutes.week. Goal should be pace of 3 miles/hours, or walking 1.5 miles in 30 minutes ?-recommend avoidance of tobacco products. Avoid excess alcohol. ?-ASCVD risk score: ?The ASCVD Risk score (Arnett DK, et al., 2019) failed to calculate for the following reasons: ?  The 2019 ASCVD risk score is only valid for ages 56 to 45   ? ?Plan for follow up: *** months or sooner as needed. ? ?Buford Dresser, MD, PhD, [facility] ?[place]   ? ?Medication Adjustments/Labs and Tests Ordered: ?Current medicines are reviewed at length with the patient today.  Concerns regarding medicines are outlined above.  ?No orders of the defined types were placed in this encounter. ? ?No orders of the defined types were placed in this encounter. ? ? ?There are no Patient Instructions on file for this visit.  ? ?I,Mathew Stumpf,acting as a Education administrator for [employer],  MD.,have documented all relevant documentation on the behalf of Buford Dresser, MD,as directed by  Buford Dresser, MD while in the presence of Buford Dresser, MD. ? ?*** ? ?Signed, ?Buford Dresser, MD PhD ?07/17/2021 7:53 AM    ?[place] ? ?

## 2021-08-15 ENCOUNTER — Encounter (HOSPITAL_BASED_OUTPATIENT_CLINIC_OR_DEPARTMENT_OTHER): Payer: Self-pay | Admitting: Cardiology

## 2021-08-15 ENCOUNTER — Ambulatory Visit (INDEPENDENT_AMBULATORY_CARE_PROVIDER_SITE_OTHER): Payer: Medicaid Other | Admitting: Cardiology

## 2021-08-15 VITALS — BP 114/80 | HR 81 | Ht 66.0 in | Wt 177.0 lb

## 2021-08-15 DIAGNOSIS — Z7189 Other specified counseling: Secondary | ICD-10-CM | POA: Diagnosis not present

## 2021-08-15 DIAGNOSIS — O1494 Unspecified pre-eclampsia, complicating childbirth: Secondary | ICD-10-CM | POA: Diagnosis not present

## 2021-08-15 DIAGNOSIS — R002 Palpitations: Secondary | ICD-10-CM

## 2021-08-15 NOTE — Progress Notes (Signed)
?Cardiology Office Note:   ? ?Date:  08/15/2021  ? ?ID:  Melanie Cooley, DOB Aug 22, 1985, MRN [id number] ? ?PCP:  Marda Stalker, PA-C  ?Cardiologist:  Buford Dresser, MD ? ?Referring MD: Wyvonnia Dusky, MD  ? ?CC: new patient consultation for palpitations ? ?History of Present Illness:   ? ?Melanie Cooley is a 36 y.o. female with a hx of pre-eclampsia who is seen as a new consult at the request of Trifan, Carola Rhine, MD for the evaluation and management of palpitations. ? ?ER note from Dr. Langston Masker dated 06/17/21 reviewed. Patient was 5 weeks postpartum and presented with palpitations. Pregnancy had been complicated by preeclampsia, for which she had been taking labetalol and nifedipine. Noted to have increasing palpitations since stopping the labetalol several days prior. ? ?Tachycardia/palpitations: ?-Initial onset: was having the worst palpitations while on nifedipine. Palpitations got better for a time, but then they recurred when she stopped the labetalol. Restarted labetalol, did a slow wean, starting taking buspar. Hasn't had them since. 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 ?-Cardiac ROS: no chest pain, no shortness of breath, no PND, no orthopnea, no LE edema. ?-Family history:  mother and father healthy. Aunt has HTN. Grandfather (deceased) had HTN. ? ?Has nausea about 3 weeks ago, felt terrible. Checked BP, was 160/90. Relaxed and BP went back down. Yesterday was a little nauseated, was on her exercise bike, felt nauseated and dizzy. BP 160/100. Remained elevated, took 1/2 labetalol. 4 hours later, BP was 106/70.  ? ?Doesn't get more than 2 hours at a time of sleep at a time, usually no more than 6  hours at a time. Baby sleeps 8-10 hours overnight, though broken up. Has to eat at least every 3 hours (woken up). Delivery was 05/09/21. ? ?Past Medical History:  ?Diagnosis Date  ? Anxiety   ? Depression   ? Preeclampsia 2023  ? Renal cell carcinoma ([place]) 2017  ? ? ?Past Surgical History:  ?Procedure Laterality Date  ? CESAREAN SECTION N/A 05/09/2021  ? Procedure: Primary CESAREAN SECTION;  Surgeon: Azucena Fallen, MD;  Location: [place] ORS;  Service: Obstetrics;  Laterality: N/A;  EDD: 06/09/21  ? INDUCED ABORTION    ? NEPHRECTOMY Right 2017  ? Partial due to cancer  ? ? ?Current Medications: ?Current Outpatient Medications on File Prior to Visit  ?Medication Sig  ? albuterol (VENTOLIN HFA) 108 (90 Base) MCG/ACT inhaler Inhale into the lungs every 6 (six) hours as needed for wheezing or shortness of breath.  ? ibuprofen (ADVIL) 600 MG tablet Take 1 tablet (600 mg total) by mouth every 6 (six) hours.  ? magnesium gluconate (MAGONATE) 500 MG tablet Take 500 mg by mouth daily.  ? Prenatal Vit-Fe Fumarate-FA (PRENATAL MULTIVITAMIN) TABS tablet Take 1 tablet by mouth daily at 12 noon.  ? ?No current facility-administered medications on file prior to visit.  ?  ? ?Allergies:   Compazine [prochlorperazine], Tape, and Betadine [povidone-iodine]  ? ?Social History  ? ?Tobacco Use  ? Smoking status: Never  ? Smokeless tobacco: Never  ?Vaping Use  ? Vaping Use: Never used  ?Substance Use Topics  ? Alcohol use: Never  ? Drug use: Never  ? ? ?Family History: ?family history includes Cancer in her maternal grandfather and maternal grandmother; Hyperlipidemia in her  father; Mesothelioma in her paternal grandmother. ? ?ROS:   ?Please see the history of present illness.  Additional pertinent ROS: ?Constitutional: Negative for chills, fever, night sweats, unintentional weight loss  ?HENT: Negative for ear pain and hearing loss.   ?Eyes: Negative for loss of vision and eye pain.  ?Respiratory: Negative for cough, sputum, wheezing.    ?Cardiovascular: See HPI. ?Gastrointestinal: Negative for abdominal pain, melena, and hematochezia.  ?Genitourinary: Negative for dysuria and hematuria.  ?Musculoskeletal: Negative for falls and myalgias.  ?Skin: Negative for itching and rash.  ?Neurological: Negative for focal weakness, focal sensory changes and loss of consciousness.  ?Endo/Heme/Allergies: Does not bruise/bleed easily.   ? ? ?EKGs/Labs/Other Studies Reviewed:   ? ?The following studies were reviewed today: ?No prior ? ?EKG:  EKG is personally reviewed.   ?08/15/21: NSR at 81 bpm ? ?Recent Labs: ?05/11/2021: ALT 29 ?06/17/2021: BUN 17; Creatinine, Ser 0.69; Hemoglobin 11.8; Magnesium 1.9; Platelets 377; Potassium 4.2; Sodium 135  ?Recent Lipid Panel ?No results found for: CHOL, TRIG, HDL, CHOLHDL, VLDL, LDLCALC, LDLDIRECT ? ?Physical Exam:   ? ?VS:  BP 114/80   Pulse 81   Ht '5\' 6"'$  (1.676 m)   Wt 177 lb (80.3 kg)   BMI 28.57 kg/m?    ? ?Wt Readings from Last 3 Encounters:  ?08/15/21 177 lb (80.3 kg)  ?06/17/21 185 lb (83.9 kg)  ?05/12/21 200 lb (90.7 kg)  ?  ?GEN: Well nourished, well developed in no acute distress ?HEENT: Normal, moist mucous membranes ?NECK: No JVD ?CARDIAC: regular rhythm, normal S1 and S2, no rubs. +S3. No murmur. ?VASCULAR: Radial and DP pulses 2+ bilaterally. No carotid bruits ?RESPIRATORY:  Clear to auscultation without rales, wheezing or rhonchi  ?ABDOMEN: Soft, non-tender, non-distended ?MUSCULOSKELETAL:  Ambulates independently ?SKIN: Warm and dry, no edema ?NEUROLOGIC:  Alert and oriented x 3. No focal neuro deficits noted. ?PSYCHIATRIC:  Normal affect   ? ?ASSESSMENT:   ? ?1. Heart palpitations   ?2. Hypertension in pregnancy, preeclampsia, delivered   ?3. Cardiac risk counseling   ?4. Counseling on health promotion and disease prevention   ? ?PLAN:   ? ?Palpitations ?-recently resolved ?-discussed Kard[employer] ?-will contact me if they recur/worsen ? ?History of pre-eclampsia and pregnancy induced  hypertension ?-discussed that this increases long term CV risk ?-now off BP meds ?-ok to take PRN 50 mg labetalol (1/2 tab) if BP elevated, but best long term strategy is sleep regulation in addition to the diet/exercise she is already doing ?-no evidence of volume overload today. Does has S3 audible intermittently ?-counseled on red flag signs that need medical attention ? ?Cardiac risk counseling and prevention recommendations: ?-recommend heart healthy/Mediterranean diet, with whole grains, fruits, vegetable, fish, lean meats, nuts, and olive oil. Limit salt. ?-recommend moderate walking, 3-5 times/week for 30-50 minutes each session. Aim for at least 150 minutes.week. Goal should be pace of 3 miles/hours, or walking 1.5 miles in 30 minutes ?-recommend avoidance of tobacco products. Avoid excess alcohol. ?-ASCVD risk score: ?The ASCVD Risk score (Arnett DK, et al., 2019) failed to calculate for the following reasons: ?  The 2019 ASCVD risk score is only valid for ages 69 to 58   ? ?Plan for follow up: as needed ? ?Buford Dresser, MD, PhD, [facility] ?[place]   ? ?Medication Adjustments/Labs and Tests Ordered: ?Current medicines are reviewed at length with the patient today.  Concerns regarding medicines are outlined above.  ?Orders Placed This Encounter  ?Procedures  ? EKG 12-Lead  ? ?  No orders of the defined types were placed in this encounter. ? ? ?Patient Instructions  ?Medication Instructions:  ?Your physician recommends that you continue on your current medications as directed. Please refer to the Current Medication list given to you today.  ? ?Labwork: ?NONE  ? ?Testing/Procedures: ?NONE  ? ?Follow-Up: ?AS NEEDED  ? ?Any Other Special Instructions Will Be Listed Below (If Applicable). ? ?Look into the [facility] for monitoring heart rhythm.  ?Signed, ?Buford Dresser, MD PhD ?08/15/2021 5:51 PM    ?[place]  ?

## 2021-08-15 NOTE — Patient Instructions (Addendum)
Medication Instructions:  ?Your physician recommends that you continue on your current medications as directed. Please refer to the Current Medication list given to you today.  ? ?Labwork: ?NONE  ? ?Testing/Procedures: ?NONE  ? ?Follow-Up: ?AS NEEDED  ? ?Any Other Special Instructions Will Be Listed Below (If Applicable). ? ?Look into the [facility] for monitoring heart rhythm. ?

## 2021-10-26 ENCOUNTER — Ambulatory Visit: Payer: Medicaid Other

## 2021-11-07 ENCOUNTER — Other Ambulatory Visit: Payer: Self-pay

## 2021-11-07 ENCOUNTER — Ambulatory Visit: Payer: Medicaid Other | Attending: Nurse Practitioner | Admitting: Physical Therapy

## 2021-11-07 DIAGNOSIS — M6281 Muscle weakness (generalized): Secondary | ICD-10-CM | POA: Diagnosis present

## 2021-11-07 DIAGNOSIS — R293 Abnormal posture: Secondary | ICD-10-CM | POA: Diagnosis not present

## 2021-11-07 DIAGNOSIS — M62838 Other muscle spasm: Secondary | ICD-10-CM | POA: Diagnosis not present

## 2021-11-07 DIAGNOSIS — R279 Unspecified lack of coordination: Secondary | ICD-10-CM | POA: Diagnosis not present

## 2021-11-07 NOTE — Therapy (Addendum)
OUTPATIENT PHYSICAL THERAPY FEMALE PELVIC EVALUATION   Patient Name: Melanie Cooley MRN: [id number] DOB:05-08-1985, 36 y.o., female Today's Date: 11/07/2021   PT End of Session - 11/07/21 1519     Visit Number 1    Date for PT Re-Evaluation 02/07/22    Authorization Type [employer] [place] medicaid    PT Start Time 1445    PT Stop Time 1523    PT Time Calculation (min) 38 min    Activity Tolerance Patient tolerated treatment well    Behavior During Therapy [facility] for tasks assessed/performed             Past Medical History:  Diagnosis Date   Anxiety    Depression    Preeclampsia 2023   Renal cell carcinoma ([place]) 2017   Past Surgical History:  Procedure Laterality Date   CESAREAN SECTION N/A 05/09/2021   Procedure: Primary CESAREAN SECTION;  Surgeon: Azucena Fallen, MD;  Location: [place] LD ORS;  Service: Obstetrics;  Laterality: N/A;  EDD: 06/09/21   INDUCED ABORTION     NEPHRECTOMY Right 2017   Partial due to cancer   Patient Active Problem List   Diagnosis Date Noted   Delivery by emergency cesarean 05/09/2021   IUGR (intrauterine growth restriction) affecting care of mother 05/08/2021   Intrauterine growth restriction affecting antepartum care of mother 04/25/2021   Mild intermittent asthma 04/15/2021   Malignant tumor of kidney ([place]) 04/15/2021   Irregular menstruation 04/15/2021   Hypercholesterolemia 04/15/2021   Posttraumatic stress disorder 04/27/2016   Personal history of other malignant neoplasm of kidney 03/21/2015   Bipolar disorder, unspecified ([place]) 03/22/2014    PCP: not listed in chart  REFERRING PROVIDER: Hessie Knows, NP  REFERRING DIAG: R10.2 (ICD-10-CM) - Pelvic and perineal pain  THERAPY DIAG:  Muscle weakness (generalized) - Plan: PT plan of care cert/re-cert  Other muscle spasm - Plan: PT plan of care cert/re-cert  Abnormal posture - Plan: PT plan of care cert/re-cert  Unspecified lack of coordination - Plan: PT plan of care  cert/re-cert  Rationale for Evaluation and Treatment Rehabilitation  ONSET DATE: 05/09/21  SUBJECTIVE:                                                                                                                                                                                           SUBJECTIVE STATEMENT: Pt had a c-section and has been having bil groin pain since this. Pt reports has had some improvement in the last month but still there.   Does still have and had prior to pregnancy will intermittently have Rt ovarian location based pain post climax.   Fluid intake: Yes:  64 oz usually per day; 1-2 cups of coffee per day     PAIN:  Are you having pain? Yes NPRS scale: 4-6/10 Pain location:  bil groin pain  Pain type: burning Pain description: intermittent   Aggravating factors: worse in mornings but a little more random throughout the day; cycling sometimes but not consistent Relieving factors: walking  PRECAUTIONS: None  WEIGHT BEARING RESTRICTIONS No  FALLS:  Has patient fallen in last 6 months? No  LIVING ENVIRONMENT: Lives with: lives with their family Lives in: House/apartment  OCCUPATION: cycle instructor   PLOF: Independent  PATIENT GOALS to have less pain  PERTINENT HISTORY:  C-section 05/09/21; Anxiety, History of cancer  Hypertension , NEPHRECTOMY PARTIAL , allergic to tape, given high risk pregnancy with severe IUGR and gHTN.  Sexual abuse: No  BOWEL MOVEMENT Pain with bowel movement: No Type of bowel movement:Type (Bristol Stool Scale) 4, Frequency daily, and Strain No Fully empty rectum: Yes:   Leakage: No Pads: No Fiber supplement: No  URINATION Pain with urination: No Fully empty bladder: Yes:   Stream: Strong Urgency: No Frequency: sometimes urinates around 2 hours, sometimes nightly 1x but only if she drinks a lot of water before bed Leakage:  none Pads: No  INTERCOURSE Pain with intercourse: After Intercourse (after  climax) Ability to have vaginal penetration:  Yes:   Climax: not painful during but sometimes after Marinoff Scale: 0/3  PREGNANCY Vaginal deliveries 0 Tearing No C-section deliveries 1 Currently pregnant No  PROLAPSE None    OBJECTIVE:   DIAGNOSTIC FINDINGS:  PFIO-7: 14  COGNITION:  Overall cognitive status: Within functional limits for tasks assessed     SENSATION:  Light touch: Appears intact  Proprioception: Appears intact  MUSCLE LENGTH: Hamstrings and adductors limited by 25%                POSTURE: rounded shoulders and anterior pelvic tilt   LUMBARAROM/PROM  A/PROM A/PROM  eval  Flexion Limited by 25%  Extension Limited by 25%  Right lateral flexion Limited by 25%  Left lateral flexion WFL  Right rotation Limited by 25%  Left rotation Limited by 25%   (Blank rows = not tested)  LOWER EXTREMITY ROM:  WFL bil   LOWER EXTREMITY MMT:  Bil hips 4+/5; knees and ankles WFL   PALPATION:   General  no TTP throughout abdomen but does have TTP at and around c-section scar.   Pt has limited mobility with tissue restrictions throughout c-section scar with Rt medial and Rt lateral quadrants the most restricted and pt reports more tender than Lt.                 External Perineal Exam pt deferred until completed period                              Internal Pelvic Floor deferred  Patient confirms identification and approves PT to assess internal pelvic floor and treatment Yes but not today as she is on her period and would like to wait until this is done  PELVIC MMT:   MMT eval  Vaginal   Internal Anal Sphincter   External Anal Sphincter   Puborectalis   Diastasis Recti   (Blank rows = not tested)        TONE: deferred  PROLAPSE: Deferred   TODAY'S TREATMENT  EVAL Examination completed, findings reviewed, pt educated on POC, HEP, and scar mobility. Pt motivated  to participate in PT and agreeable to attempt recommendations.     If treatment  provided at initial evaluation, no treatment charged due to lack of authorization.        PATIENT EDUCATION:  Education details: [id number] Person educated: Patient Education method: Explanation, Demonstration, Tactile cues, Verbal cues, and Handouts Education comprehension: verbalized understanding and returned demonstration   HOME EXERCISE PROGRAM: [id number]  ASSESSMENT:  CLINICAL IMPRESSION: Patient is a 36 y.o. female  who was seen today for physical therapy evaluation and treatment for postpartum pelvic pain status post c-section with first baby. Pt reports she had traumatic pregnancy and c-section with complicated pregnancy and need of hospital admission during third trimester. Pt now doing better and baby is healthy but bil groin pain is pt's largest concern as well as pain at lower right abdominal quadrant post climax. Pt denied all other pain or symptoms but is found to have TTP and restricted mobility at c-section scar. 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 Pt also found to have decreased spinal and hip flexibility, mild hip weakness and decreased core strength. Pt would benefit from additional PT to further address deficits.     OBJECTIVE IMPAIRMENTS decreased endurance, decreased mobility, decreased strength, increased fascial restrictions, increased muscle spasms, impaired flexibility, improper body mechanics, postural dysfunction, and pain.   ACTIVITY LIMITATIONS carrying, lifting, and locomotion level  PARTICIPATION LIMITATIONS: interpersonal relationship and community activity  PERSONAL FACTORS Past/current experiences, Time since onset of injury/illness/exacerbation, and 1 comorbidity: recent c-section and medical history  are also affecting patient's functional outcome.   REHAB POTENTIAL: Good  CLINICAL DECISION MAKING: Stable/uncomplicated  EVALUATION COMPLEXITY: Low   GOALS: Goals reviewed with patient?  Yes  SHORT TERM GOALS: Target date: 12/05/2021  Pt to be I with HEP.  Baseline: Goal status: INITIAL  2.  Pt to demonstrate at least 3/5 pelvic floor strength for improved pelvic stability and decreased strain at pelvic floor Baseline:  Goal status: INITIAL  3.  Pt to be I with scar mobility and TA activations in supine or hooklying for improved tissue mobility and core strengthening.  Baseline:  Goal status: INITIAL   LONG TERM GOALS: Target date:  02/07/22    Pt to be I with advanced HEP.  Baseline:  Goal status: INITIAL  2.  Pt to demonstrate at least 5/5 bil hip strength for improved pelvic stability and functional squats of at least 25# without pain.  Baseline:  Goal status: INITIAL  3.  Pt to demonstrate at least 4/5 pelvic floor strength for improved pelvic stability and decreased strain at pelvic floor. Baseline:  Goal status: INITIAL  4.  Pt to report no more than 2/10 pain at bil groin due to improved tissue mobility and core strengthening for improved tolerance to exercise.  Baseline:  Goal status: INITIAL   PLAN: PT FREQUENCY: 1x/week  PT DURATION:  8 sessions  PLANNED INTERVENTIONS: Therapeutic exercises, Therapeutic activity, Neuromuscular re-education, Balance training, Self Care, Joint mobilization, Aquatic Therapy, Dry Needling, Spinal mobilization, Cryotherapy, Moist heat, scar mobilization, Taping, Biofeedback, and Manual therapy  PLAN FOR NEXT SESSION: internal if needed and pt consents, manual at spine and abdomen, core activations and coordination of breathing and pelvic floor mobility with strengthening   Stacy Gardner, PT, DPT 07/25/234:03 PM

## 2021-11-16 ENCOUNTER — Ambulatory Visit: Payer: Medicaid Other | Attending: Nurse Practitioner | Admitting: Physical Therapy

## 2021-11-16 DIAGNOSIS — R279 Unspecified lack of coordination: Secondary | ICD-10-CM | POA: Diagnosis present

## 2021-11-16 DIAGNOSIS — M62838 Other muscle spasm: Secondary | ICD-10-CM | POA: Diagnosis present

## 2021-11-16 DIAGNOSIS — M6281 Muscle weakness (generalized): Secondary | ICD-10-CM | POA: Insufficient documentation

## 2021-11-16 NOTE — Therapy (Signed)
OUTPATIENT PHYSICAL THERAPY FEMALE PELVIC EVALUATION   Patient Name: Melanie Cooley MRN: [id number] DOB:May 21, 1985, 36 y.o., female Today's Date: 11/16/2021   PT End of Session - 11/16/21 1641     Visit Number 2    Date for PT Re-Evaluation 02/07/22    Authorization Type healthy blue medicaid    PT Start Time 1530    PT Stop Time 1611    PT Time Calculation (min) 41 min    Activity Tolerance Patient tolerated treatment well    Behavior During Therapy [facility] for tasks assessed/performed              Past Medical History:  Diagnosis Date   Anxiety    Depression    Preeclampsia 2023   Renal cell carcinoma ([place]) 2017   Past Surgical History:  Procedure Laterality Date   CESAREAN SECTION N/A 05/09/2021   Procedure: Primary CESAREAN SECTION;  Surgeon: Azucena Fallen, MD;  Location: [place] LD ORS;  Service: Obstetrics;  Laterality: N/A;  EDD: 06/09/21   INDUCED ABORTION     NEPHRECTOMY Right 2017   Partial due to cancer   Patient Active Problem List   Diagnosis Date Noted   Delivery by emergency cesarean 05/09/2021   IUGR (intrauterine growth restriction) affecting care of mother 05/08/2021   Intrauterine growth restriction affecting antepartum care of mother 04/25/2021   Mild intermittent asthma 04/15/2021   Malignant tumor of kidney ([place]) 04/15/2021   Irregular menstruation 04/15/2021   Hypercholesterolemia 04/15/2021   Posttraumatic stress disorder 04/27/2016   Personal history of other malignant neoplasm of kidney 03/21/2015   Bipolar disorder, unspecified ([place]) 03/22/2014    PCP: not listed in chart  REFERRING PROVIDER: Hessie Knows, NP  REFERRING DIAG: R10.2 (ICD-10-CM) - Pelvic and perineal pain  THERAPY DIAG:  Muscle weakness (generalized)  Unspecified lack of coordination  Rationale for Evaluation and Treatment Rehabilitation  ONSET DATE: 05/09/21  SUBJECTIVE:                                                                                                                                                                                            SUBJECTIVE STATEMENT: Pt reports she has had a little worse pain. Pt reports she has been having some aching around c-section scar for the past 3 days. However as started new core work outs this week and this makes her a little more sore too.    Fluid intake: Yes: 64 oz usually per day; 1-2 cups of coffee per day     PAIN:  Are you having pain? Yes NPRS scale: 4-6/10 Pain location:  bil groin pain  Pain type: burning Pain  description: intermittent   Aggravating factors: worse in mornings but a little more random throughout the day; cycling sometimes but not consistent Relieving factors: walking  PRECAUTIONS: None  WEIGHT BEARING RESTRICTIONS No  FALLS:  Has patient fallen in last 6 months? No  LIVING ENVIRONMENT: Lives with: lives with their family Lives in: House/apartment  OCCUPATION: cycle instructor   PLOF: Independent  PATIENT GOALS to have less pain  PERTINENT HISTORY:  C-section 05/09/21; Anxiety, History of cancer  Hypertension , NEPHRECTOMY PARTIAL , allergic to tape, given high risk pregnancy with severe IUGR and gHTN.  Sexual abuse: No  BOWEL MOVEMENT Pain with bowel movement: No Type of bowel movement:Type (Bristol Stool Scale) 4, Frequency daily, and Strain No Fully empty rectum: Yes:   Leakage: No Pads: No Fiber supplement: No  URINATION Pain with urination: No Fully empty bladder: Yes:   Stream: Strong Urgency: No Frequency: sometimes urinates around 2 hours, sometimes nightly 1x but only if she drinks a lot of water before bed Leakage:  none Pads: No  INTERCOURSE Pain with intercourse: After Intercourse (after climax) Ability to have vaginal penetration:  Yes:   Climax: not painful during but sometimes after Marinoff Scale: 0/3  PREGNANCY Vaginal deliveries 0 Tearing No C-section deliveries 1 Currently pregnant  No  PROLAPSE None    OBJECTIVE:   DIAGNOSTIC FINDINGS:  PFIO-7: 14  COGNITION:  Overall cognitive status: Within functional limits for tasks assessed     SENSATION:  Light touch: Appears intact  Proprioception: Appears intact  MUSCLE LENGTH: Hamstrings and adductors limited by 25%                POSTURE: rounded shoulders and anterior pelvic tilt   LUMBARAROM/PROM  A/PROM A/PROM  eval  Flexion Limited by 25%  Extension Limited by 25%  Right lateral flexion Limited by 25%  Left lateral flexion WFL  Right rotation Limited by 25%  Left rotation Limited by 25%   (Blank rows = not tested)  LOWER EXTREMITY ROM:  WFL bil   LOWER EXTREMITY MMT:  Bil hips 4+/5; knees and ankles WFL   PALPATION:   General  no TTP throughout abdomen but does have TTP at and around c-section scar.   Pt has limited mobility with tissue restrictions throughout c-section scar with Rt medial and Rt lateral quadrants the most restricted and pt reports more tender than Lt.                 External Perineal Exam pt deferred until completed period                              Internal Pelvic Floor deferred  Patient confirms identification and approves PT to assess internal pelvic floor and treatment Yes but not today as she is on her period and would like to wait until this is done  PELVIC MMT:   MMT eval  Vaginal   Internal Anal Sphincter   External Anal Sphincter   Puborectalis   Diastasis Recti   (Blank rows = not tested)        TONE: deferred  PROLAPSE: Deferred   TODAY'S TREATMENT   11/16/2021: Opp hand/knee ball press 2x10  2x10 bear crawl in place in quad Lateral trunk on red ball 2x30s Trunk extension on red ball 2x30s each way Thoracic rotation openers 2x30s each Childs pose rotation 2x30s each Manual: Bil groin areas and lower rt/lt abdominal quadrants with tightness felt  here. Pt responded well with this and x10 diaphragmatic breathing after reporting less  tightness felt and reports she had less pain here after this.   EVAL Examination completed, findings reviewed, pt educated on POC, HEP, and scar mobility. Pt motivated to participate in PT and agreeable to attempt recommendations.     If treatment provided at initial evaluation, no treatment charged due to lack of authorization.        PATIENT EDUCATION:  Education details: [id number] Person educated: Patient Education method: Explanation, Demonstration, Tactile cues, Verbal cues, and Handouts Education comprehension: verbalized understanding and returned demonstration   HOME EXERCISE PROGRAM: [id number]  ASSESSMENT:  CLINICAL IMPRESSION: Patient presents stating she has been increasing workouts and core work and has noted more groin pain. Educated on attempting less intensity or reps of workouts. Pt agreed. Session focused on core/TA activation and lumbar placement with all exercises and pt reports this felt better and doesn't think she was activating core well. Manual work also at abdomen and pelvis for improved tissue mobility and decreased pain. Pt would benefit from additional PT to further address deficits.     OBJECTIVE IMPAIRMENTS decreased endurance, decreased mobility, decreased strength, increased fascial restrictions, increased muscle spasms, impaired flexibility, improper body mechanics, postural dysfunction, and pain.   ACTIVITY LIMITATIONS carrying, lifting, and locomotion level  PARTICIPATION LIMITATIONS: interpersonal relationship and community activity  PERSONAL FACTORS Past/current experiences, Time since onset of injury/illness/exacerbation, and 1 comorbidity: recent c-section and medical history  are also affecting patient's functional outcome.   REHAB POTENTIAL: Good  CLINICAL DECISION MAKING: Stable/uncomplicated  EVALUATION COMPLEXITY: Low   GOALS: Goals reviewed with patient? Yes  SHORT TERM GOALS: Target date: 12/05/2021  Pt to be I with HEP.   Baseline: Goal status: INITIAL  2.  Pt to demonstrate at least 3/5 pelvic floor strength for improved pelvic stability and decreased strain at pelvic floor Baseline:  Goal status: INITIAL  3.  Pt to be I with scar mobility and TA activations in supine or hooklying for improved tissue mobility and core strengthening.  Baseline:  Goal status: INITIAL   LONG TERM GOALS: Target date:  02/07/22    Pt to be I with advanced HEP.  Baseline:  Goal status: INITIAL  2.  Pt to demonstrate at least 5/5 bil hip strength for improved pelvic stability and functional squats of at least 25# without pain.  Baseline:  Goal status: INITIAL  3.  Pt to demonstrate at least 4/5 pelvic floor strength for improved pelvic stability and decreased strain at pelvic floor. Baseline:  Goal status: INITIAL  4.  Pt to report no more than 2/10 pain at bil groin due to improved tissue mobility and core strengthening for improved tolerance to exercise.  Baseline:  Goal status: INITIAL   PLAN: PT FREQUENCY: 1x/week  PT DURATION:  8 sessions  PLANNED INTERVENTIONS: Therapeutic exercises, Therapeutic activity, Neuromuscular re-education, Balance training, Self Care, Joint mobilization, Aquatic Therapy, Dry Needling, Spinal mobilization, Cryotherapy, Moist heat, scar mobilization, Taping, Biofeedback, and Manual therapy  PLAN FOR NEXT SESSION: internal if needed and pt consents, manual at spine and abdomen, core activations and coordination of breathing and pelvic floor mobility with strengthening   Stacy Gardner, PT, DPT 08/03/234:44 PM

## 2021-11-30 ENCOUNTER — Ambulatory Visit: Payer: Medicaid Other | Admitting: Physical Therapy

## 2021-12-08 ENCOUNTER — Ambulatory Visit: Payer: Medicaid Other | Admitting: Physical Therapy

## 2021-12-08 DIAGNOSIS — M6281 Muscle weakness (generalized): Secondary | ICD-10-CM

## 2021-12-08 DIAGNOSIS — R279 Unspecified lack of coordination: Secondary | ICD-10-CM

## 2021-12-08 NOTE — Therapy (Signed)
OUTPATIENT PHYSICAL THERAPY FEMALE PELVIC EVALUATION   Patient Name: Melanie Cooley MRN: [id number] DOB:1985/08/05, 36 y.o., female Today's Date: 12/08/2021   PT End of Session - 12/08/21 0939     Visit Number 3    Date for PT Re-Evaluation 02/07/22    Authorization Type healthy blue medicaid    PT Start Time [phone]   pt arrival time   PT Stop Time 0933    PT Time Calculation (min) 39 min    Activity Tolerance Patient tolerated treatment well    Behavior During Therapy [facility] for tasks assessed/performed               Past Medical History:  Diagnosis Date   Anxiety    Depression    Preeclampsia 2023   Renal cell carcinoma ([place]) 2017   Past Surgical History:  Procedure Laterality Date   CESAREAN SECTION N/A 05/09/2021   Procedure: Primary CESAREAN SECTION;  Surgeon: Azucena Fallen, MD;  Location: [place] LD ORS;  Service: Obstetrics;  Laterality: N/A;  EDD: 06/09/21   INDUCED ABORTION     NEPHRECTOMY Right 2017   Partial due to cancer   Patient Active Problem List   Diagnosis Date Noted   Delivery by emergency cesarean 05/09/2021   IUGR (intrauterine growth restriction) affecting care of mother 05/08/2021   Intrauterine growth restriction affecting antepartum care of mother 04/25/2021   Mild intermittent asthma 04/15/2021   Malignant tumor of kidney ([place]) 04/15/2021   Irregular menstruation 04/15/2021   Hypercholesterolemia 04/15/2021   Posttraumatic stress disorder 04/27/2016   Personal history of other malignant neoplasm of kidney 03/21/2015   Bipolar disorder, unspecified ([place]) 03/22/2014    PCP: not listed in chart  REFERRING PROVIDER: Hessie Knows, NP  REFERRING DIAG: R10.2 (ICD-10-CM) - Pelvic and perineal pain  THERAPY DIAG:  Muscle weakness (generalized)  Unspecified lack of coordination  Rationale for Evaluation and Treatment Rehabilitation  ONSET DATE: 05/09/21  SUBJECTIVE:                                                                                                                                                                                            SUBJECTIVE STATEMENT: Pt reports pain has been improving, she continues to work out has soreness at abdomen but not bad. Mostly notices sensitivity at scar and "small shelf".    Fluid intake: Yes: 64 oz usually per day; 1-2 cups of coffee per day     PAIN:  Are you having pain? Yes (not all the time but mostly random though)  NPRS scale: 3/10 Pain location:  bil groin pain  Pain type: burning Pain description: intermittent  Aggravating factors: mornings, laying down prolonged (sleeping) Relieving factors: walking  PRECAUTIONS: None  WEIGHT BEARING RESTRICTIONS No  FALLS:  Has patient fallen in last 6 months? No  LIVING ENVIRONMENT: Lives with: lives with their family Lives in: House/apartment  OCCUPATION: cycle instructor   PLOF: Independent  PATIENT GOALS to have less pain  PERTINENT HISTORY:  C-section 05/09/21; Anxiety, History of cancer  Hypertension , NEPHRECTOMY PARTIAL , allergic to tape, given high risk pregnancy with severe IUGR and gHTN.  Sexual abuse: No  BOWEL MOVEMENT Pain with bowel movement: No Type of bowel movement:Type (Bristol Stool Scale) 4, Frequency daily, and Strain No Fully empty rectum: Yes:   Leakage: No Pads: No Fiber supplement: No  URINATION Pain with urination: No Fully empty bladder: Yes:   Stream: Strong Urgency: No Frequency: sometimes urinates around 2 hours, sometimes nightly 1x but only if she drinks a lot of water before bed Leakage:  none Pads: No  INTERCOURSE Pain with intercourse: After Intercourse (after climax) Ability to have vaginal penetration:  Yes:   Climax: not painful during but sometimes after Marinoff Scale: 0/3  PREGNANCY Vaginal deliveries 0 Tearing No C-section deliveries 1 Currently pregnant No  PROLAPSE None    OBJECTIVE:   DIAGNOSTIC FINDINGS:  PFIO-7:  14  COGNITION:  Overall cognitive status: Within functional limits for tasks assessed     SENSATION:  Light touch: Appears intact  Proprioception: Appears intact  MUSCLE LENGTH: Hamstrings and adductors limited by 25%                POSTURE: rounded shoulders and anterior pelvic tilt   LUMBARAROM/PROM  A/PROM A/PROM  eval  Flexion Limited by 25%  Extension Limited by 25%  Right lateral flexion Limited by 25%  Left lateral flexion WFL  Right rotation Limited by 25%  Left rotation Limited by 25%   (Blank rows = not tested)  LOWER EXTREMITY ROM:  WFL bil   LOWER EXTREMITY MMT:  Bil hips 4+/5; knees and ankles WFL   PALPATION:   General  no TTP throughout abdomen but does have TTP at and around c-section scar.   Pt has limited mobility with tissue restrictions throughout c-section scar with Rt medial and Rt lateral quadrants the most restricted and pt reports more tender than Lt.                 External Perineal Exam pt deferred until completed period                              Internal Pelvic Floor deferred  Patient confirms identification and approves PT to assess internal pelvic floor and treatment Yes but not today as she is on her period and would like to wait until this is done  12/08/2021 pt deferred   PELVIC MMT:   MMT eval  Vaginal   Internal Anal Sphincter   External Anal Sphincter   Puborectalis   Diastasis Recti   (Blank rows = not tested)        TONE: deferred  PROLAPSE: Deferred   TODAY'S TREATMENT   12/08/2021  Squats 20# 2x10 Isometric 10# cable hold in unilateral elbow extension with alt marching x10 each arm Lunge with 10# cable row x10 each Cable palloffs 5# x10 each  Lat pulldown 35# 2x10 Manual: Scar mobilization  - supine with PT providing c-section scar mobilization in all directions, Rt mid and Lt mid scar moving well without  restrictions, Rt and Lt lateral scars had restrictions in rotational directions both ways and in  lateral directions. Gentle stretching provided without pain from pt and good mobility noted.      PATIENT EDUCATION:  Education details: [id number] Person educated: Patient Education method: Explanation, Demonstration, Tactile cues, Verbal cues, and Handouts Education comprehension: verbalized understanding and returned demonstration   HOME EXERCISE PROGRAM: [id number]  ASSESSMENT:  CLINICAL IMPRESSION: Patient reports she has been seeing improvements with much less pain in bil groin and abdomen, still present but not as bad or as often. Pt continues to be active and working out, and states she has been doing scar mobility at home as well. Pt session focused on hip and core strengthening with emphasis on TA activation with pt demonstrating good ability to complete exercises with minimal cues. Pt does benefit from cues for  technique. Manual also completed for scar mobility at end of session. Pt would benefit from additional PT to further address deficits.     OBJECTIVE IMPAIRMENTS decreased endurance, decreased mobility, decreased strength, increased fascial restrictions, increased muscle spasms, impaired flexibility, improper body mechanics, postural dysfunction, and pain.   ACTIVITY LIMITATIONS carrying, lifting, and locomotion level  PARTICIPATION LIMITATIONS: interpersonal relationship and community activity  PERSONAL FACTORS Past/current experiences, Time since onset of injury/illness/exacerbation, and 1 comorbidity: recent c-section and medical history  are also affecting patient's functional outcome.   REHAB POTENTIAL: Good  CLINICAL DECISION MAKING: Stable/uncomplicated  EVALUATION COMPLEXITY: Low   GOALS: Goals reviewed with patient? Yes  SHORT TERM GOALS: Target date: 12/05/2021  Pt to be I with HEP.  Baseline: Goal status: INITIAL  2.  Pt to demonstrate at least 3/5 pelvic floor strength for improved pelvic stability and decreased strain at pelvic floor Baseline:   Goal status: INITIAL  3.  Pt to be I with scar mobility and TA activations in supine or hooklying for improved tissue mobility and core strengthening.  Baseline:  Goal status: INITIAL   LONG TERM GOALS: Target date:  02/07/22    Pt to be I with advanced HEP.  Baseline:  Goal status: INITIAL  2.  Pt to demonstrate at least 5/5 bil hip strength for improved pelvic stability and functional squats of at least 25# without pain.  Baseline:  Goal status: INITIAL  3.  Pt to demonstrate at least 4/5 pelvic floor strength for improved pelvic stability and decreased strain at pelvic floor. Baseline:  Goal status: INITIAL  4.  Pt to report no more than 2/10 pain at bil groin due to improved tissue mobility and core strengthening for improved tolerance to exercise.  Baseline:  Goal status: INITIAL   PLAN: PT FREQUENCY: 1x/week  PT DURATION:  8 sessions  PLANNED INTERVENTIONS: Therapeutic exercises, Therapeutic activity, Neuromuscular re-education, Balance training, Self Care, Joint mobilization, Aquatic Therapy, Dry Needling, Spinal mobilization, Cryotherapy, Moist heat, scar mobilization, Taping, Biofeedback, and Manual therapy  PLAN FOR NEXT SESSION: internal if needed and pt consents, manual at spine and abdomen, core activations and coordination of breathing and pelvic floor mobility with strengthening   Stacy Gardner, PT, DPT 08/25/239:40 AM

## 2021-12-12 ENCOUNTER — Ambulatory Visit: Payer: Medicaid Other | Admitting: Physical Therapy

## 2021-12-12 DIAGNOSIS — M6281 Muscle weakness (generalized): Secondary | ICD-10-CM

## 2021-12-12 DIAGNOSIS — R279 Unspecified lack of coordination: Secondary | ICD-10-CM

## 2021-12-12 DIAGNOSIS — M62838 Other muscle spasm: Secondary | ICD-10-CM

## 2021-12-12 NOTE — Therapy (Signed)
OUTPATIENT PHYSICAL THERAPY FEMALE PELVIC EVALUATION   Patient Name: Melanie Cooley MRN: [id number] DOB:[birth year], 36 y.o., female Today's Date: 12/12/2021   PT End of Session - 12/12/21 1610     Visit Number 4    Date for PT Re-Evaluation 02/07/22    Authorization Type healthy blue medicaid    PT Start Time 9371   pt arrival time   PT Stop Time 1610    PT Time Calculation (min) 27 min    Activity Tolerance Patient tolerated treatment well    Behavior During Therapy [facility] for tasks assessed/performed               Past Medical History:  Diagnosis Date   Anxiety    Depression    Preeclampsia 2023   Renal cell carcinoma ([place]) 2017   Past Surgical History:  Procedure Laterality Date   CESAREAN SECTION N/A 05/09/2021   Procedure: Primary CESAREAN SECTION;  Surgeon: Azucena Fallen, MD;  Location: [place] [facility];  Service: Obstetrics;  Laterality: N/A;  EDD: 06/09/21   INDUCED ABORTION     NEPHRECTOMY Right 2017   Partial due to cancer   Patient Active Problem List   Diagnosis Date Noted   Delivery by emergency cesarean 05/09/2021   IUGR (intrauterine growth restriction) affecting care of mother 05/08/2021   Intrauterine growth restriction affecting antepartum care of mother 04/25/2021   Mild intermittent asthma 04/15/2021   Malignant tumor of kidney ([employer]) 04/15/2021   Irregular menstruation 04/15/2021   Hypercholesterolemia 04/15/2021   Posttraumatic stress disorder 04/27/2016   Personal history of other malignant neoplasm of kidney 03/21/2015   Bipolar disorder, unspecified ([place]) 03/22/2014    PCP: not listed in chart  REFERRING PROVIDER: Hessie Knows, NP  REFERRING DIAG: R10.2 (ICD-10-CM) - Pelvic and perineal pain  THERAPY DIAG:  Muscle weakness (generalized)  Other muscle spasm  Unspecified lack of coordination  Rationale for Evaluation and Treatment Rehabilitation  ONSET DATE: 05/09/21  SUBJECTIVE:                                                                                                                                                                                            SUBJECTIVE STATEMENT: Pt reports she has been better about stretching more and feels better overall but did have one instance of tightness/pain at bil groin and woke her from sleep. She did stretching and returned to sleep without pain and this hasn't happened since starting HEP except this time. Pt does endorse returning to intercourse and had pain she had prior to PT with intercourse which pt indicates "I think it's my ovaries".     Fluid  intake: Yes: 64 oz usually per day; 1-2 cups of coffee per day     PAIN:  Are you having pain? Yes (not all the time but mostly random though)  NPRS scale: 3/10 Pain location:  bil groin pain  Pain type: burning Pain description: intermittent   Aggravating factors: mornings, laying down prolonged (sleeping) Relieving factors: walking  PRECAUTIONS: None  WEIGHT BEARING RESTRICTIONS No  FALLS:  Has patient fallen in last 6 months? No  LIVING ENVIRONMENT: Lives with: lives with their family Lives in: House/apartment  OCCUPATION: cycle instructor   PLOF: Independent  PATIENT GOALS to have less pain  PERTINENT HISTORY:  C-section 05/09/21; Anxiety, History of cancer  Hypertension , NEPHRECTOMY PARTIAL , allergic to tape, given high risk pregnancy with severe IUGR and gHTN.  Sexual abuse: No  BOWEL MOVEMENT Pain with bowel movement: No Type of bowel movement:Type (Bristol Stool Scale) 4, Frequency daily, and Strain No Fully empty rectum: Yes:   Leakage: No Pads: No Fiber supplement: No  URINATION Pain with urination: No Fully empty bladder: Yes:   Stream: Strong Urgency: No Frequency: sometimes urinates around 2 hours, sometimes nightly 1x but only if she drinks a lot of water before bed Leakage:  none Pads: No  INTERCOURSE Pain with intercourse: After Intercourse (after climax) Ability to have  vaginal penetration:  Yes:   Climax: not painful during but sometimes after Marinoff Scale: 0/3  PREGNANCY Vaginal deliveries 0 Tearing No C-section deliveries 1 Currently pregnant No  PROLAPSE None    OBJECTIVE:   DIAGNOSTIC FINDINGS:  PFIO-7: 14  COGNITION:  Overall cognitive status: Within functional limits for tasks assessed     SENSATION:  Light touch: Appears intact  Proprioception: Appears intact  MUSCLE LENGTH: Hamstrings and adductors limited by 25%                POSTURE: rounded shoulders and anterior pelvic tilt   LUMBARAROM/PROM  A/PROM A/PROM  eval  Flexion Limited by 25%  Extension Limited by 25%  Right lateral flexion Limited by 25%  Left lateral flexion WFL  Right rotation Limited by 25%  Left rotation Limited by 25%   (Blank rows = not tested)  LOWER EXTREMITY ROM:  WFL bil   LOWER EXTREMITY MMT:  Bil hips 4+/5; knees and ankles WFL   PALPATION:   General  no TTP throughout abdomen but does have TTP at and around c-section scar.   Pt has limited mobility with tissue restrictions throughout c-section scar with Rt medial and Rt lateral quadrants the most restricted and pt reports more tender than Lt.                 External Perineal Exam pt deferred until completed period                              Internal Pelvic Floor deferred  Patient confirms identification and approves PT to assess internal pelvic floor and treatment Yes but not today as she is on her period and would like to wait until this is done  12/08/2021 pt deferred  12/12/21 yes - No emotional/communication barriers or cognitive limitation. Patient is motivated to learn. Patient understands and agrees with treatment goals and plan. PT explains patient will be examined in standing, sitting, and lying down to see how their muscles and joints work. When they are ready, they will be asked to remove their underwear so PT can examine their  perineum. The patient is also given the  option of providing their own chaperone as one is not provided in our facility. The patient also has the right and is explained the right to defer or refuse any part of the evaluation or treatment including the internal exam. With the patient's consent, PT will use one gloved finger to gently assess the muscles of the pelvic floor, seeing how well it contracts and relaxes and if there is muscle symmetry. After, the patient will get dressed and PT and patient will discuss exam findings and plan of care. PT and patient discuss plan of care, schedule, attendance policy and HEP activities.   PELVIC MMT:   MMT eval  Vaginal 4/5; 7s; 7 reps  Internal Anal Sphincter   External Anal Sphincter   Puborectalis   Diastasis Recti   (Blank rows = not tested)        TONE: 12/12/21 WFL  PROLAPSE: 08/29/23not seen in hooklying   TODAY'S TREATMENT  12/12/21  Manual: pt agreed to internal vaginal assessment and treatment today as she did have continued pelvic pain with intercourse that she had pre pregnancy now that she has returned to intercourse postpartum. Pt found to have tension at Rt obturator internus, pubococcygeus, and iliococcygeus with trigger points throughout these areas. No tightness or trigger points on Lt side. Pt reports once palpating trigger points this is replicable to her pain with groin pain and intercourse. Trigger points released well with gentle overpressure and pt reported she could feel release as well. Pt pleased with this and shown on pelvic model female anatomy where these muscle are in relation to hip/groin.   12/08/2021  Squats 20# 2x10 Isometric 10# cable hold in unilateral elbow extension with alt marching x10 each arm Lunge with 10# cable row x10 each Cable palloffs 5# x10 each  Lat pulldown 35# 2x10 Manual: Scar mobilization  - supine with PT providing c-section scar mobilization in all directions, Rt mid and Lt mid scar moving well without restrictions, Rt and Lt lateral  scars had restrictions in rotational directions both ways and in lateral directions. Gentle stretching provided without pain from pt and good mobility noted.      PATIENT EDUCATION:  Education details: [id number] Person educated: Patient Education method: Explanation, Demonstration, Tactile cues, Verbal cues, and Handouts Education comprehension: verbalized understanding and returned demonstration   HOME EXERCISE PROGRAM: [id number]  ASSESSMENT:  CLINICAL IMPRESSION: Patient reports overall continued improvement, pleased with progress. Pt did endorse pain with intercourse, consented to internal vaginal assessment today for treatment. Findings above. Pt tolerated well with trigger points released and pt reporting she could feel less pain at Rt groin area and lower abdomen she was feeling last night. Pt would benefit from additional PT to further address deficits.     OBJECTIVE IMPAIRMENTS decreased endurance, decreased mobility, decreased strength, increased fascial restrictions, increased muscle spasms, impaired flexibility, improper body mechanics, postural dysfunction, and pain.   ACTIVITY LIMITATIONS carrying, lifting, and locomotion level  PARTICIPATION LIMITATIONS: interpersonal relationship and community activity  PERSONAL FACTORS Past/current experiences, Time since onset of injury/illness/exacerbation, and 1 comorbidity: recent c-section and medical history  are also affecting patient's functional outcome.   REHAB POTENTIAL: Good  CLINICAL DECISION MAKING: Stable/uncomplicated  EVALUATION COMPLEXITY: Low   GOALS: Goals reviewed with patient? Yes  SHORT TERM GOALS: Target date: 12/05/2021  Pt to be I with HEP.  Baseline: Goal status: INITIAL  2.  Pt to demonstrate at least 3/5 pelvic floor strength for improved pelvic  stability and decreased strain at pelvic floor Baseline:  Goal status: INITIAL  3.  Pt to be I with scar mobility and TA activations in supine or  hooklying for improved tissue mobility and core strengthening.  Baseline:  Goal status: INITIAL   LONG TERM GOALS: Target date:  02/07/22    Pt to be I with advanced HEP.  Baseline:  Goal status: INITIAL  2.  Pt to demonstrate at least 5/5 bil hip strength for improved pelvic stability and functional squats of at least 25# without pain.  Baseline:  Goal status: INITIAL  3.  Pt to demonstrate at least 4/5 pelvic floor strength for improved pelvic stability and decreased strain at pelvic floor. Baseline:  Goal status: INITIAL  4.  Pt to report no more than 2/10 pain at bil groin due to improved tissue mobility and core strengthening for improved tolerance to exercise.  Baseline:  Goal status: INITIAL   PLAN: PT FREQUENCY: 1x/week  PT DURATION:  8 sessions  PLANNED INTERVENTIONS: Therapeutic exercises, Therapeutic activity, Neuromuscular re-education, Balance training, Self Care, Joint mobilization, Aquatic Therapy, Dry Needling, Spinal mobilization, Cryotherapy, Moist heat, scar mobilization, Taping, Biofeedback, and Manual therapy  PLAN FOR NEXT SESSION: internal if needed and pt consents, manual at spine and abdomen, core activations and coordination of breathing and pelvic floor mobility with strengthening   Stacy Gardner, PT, DPT 08/29/234:17 PM

## 2021-12-21 ENCOUNTER — Ambulatory Visit: Payer: Medicaid Other | Admitting: Physical Therapy

## 2021-12-28 ENCOUNTER — Ambulatory Visit: Payer: Medicaid Other | Attending: Nurse Practitioner | Admitting: Physical Therapy

## 2021-12-28 ENCOUNTER — Encounter: Payer: Self-pay | Admitting: Physical Therapy

## 2021-12-28 DIAGNOSIS — M6281 Muscle weakness (generalized): Secondary | ICD-10-CM | POA: Insufficient documentation

## 2021-12-28 DIAGNOSIS — M62838 Other muscle spasm: Secondary | ICD-10-CM | POA: Insufficient documentation

## 2021-12-28 NOTE — Therapy (Signed)
OUTPATIENT PHYSICAL THERAPY FEMALE PELVIC EVALUATION   Patient Name: Melanie Cooley MRN: [id number] DOB:November 20, 1985, 36 y.o., female Today's Date: 12/28/2021   PT End of Session - 12/28/21 1452     Visit Number 5    Date for PT Re-Evaluation 02/07/22    Authorization Type healthy blue medicaid    PT Start Time 1451   pt arrival time   PT Stop Time 1530    PT Time Calculation (min) 39 min    Activity Tolerance Patient tolerated treatment well    Behavior During Therapy [facility] for tasks assessed/performed               Past Medical History:  Diagnosis Date   Anxiety    Depression    Preeclampsia 2023   Renal cell carcinoma ([place]) 2017   Past Surgical History:  Procedure Laterality Date   CESAREAN SECTION N/A 05/09/2021   Procedure: Primary CESAREAN SECTION;  Surgeon: Azucena Fallen, MD;  Location: [place] [facility];  Service: Obstetrics;  Laterality: N/A;  EDD: 06/09/21   INDUCED ABORTION     NEPHRECTOMY Right 2017   Partial due to cancer   Patient Active Problem List   Diagnosis Date Noted   Delivery by emergency cesarean 05/09/2021   IUGR (intrauterine growth restriction) affecting care of mother 05/08/2021   Intrauterine growth restriction affecting antepartum care of mother 04/25/2021   Mild intermittent asthma 04/15/2021   Malignant tumor of kidney ([place]) 04/15/2021   Irregular menstruation 04/15/2021   Hypercholesterolemia 04/15/2021   Posttraumatic stress disorder 04/27/2016   Personal history of other malignant neoplasm of kidney 03/21/2015   Bipolar disorder, unspecified ([place]) 03/22/2014    PCP: not listed in chart  REFERRING PROVIDER: Hessie Knows, NP  REFERRING DIAG: R10.2 (ICD-10-CM) - Pelvic and perineal pain  THERAPY DIAG:  Muscle weakness (generalized)  Other muscle spasm  Rationale for Evaluation and Treatment Rehabilitation  ONSET DATE: 05/09/21  SUBJECTIVE:                                                                                                                                                                                            SUBJECTIVE STATEMENT: Pt reports everything has been better. Pt reports she does have "very slight" pain at night after sleeping in a ball or after a hard cycle class where she is more flexed but with extension stretching this goes away.    Fluid intake: Yes: 64 oz usually per day; 1-2 cups of coffee per day     PAIN:  Are you having pain? No  NPRS scale: 0/10 Pain location:  bil groin pain  Pain type: burning  Pain description: intermittent   Aggravating factors: mornings, laying down prolonged (sleeping) Relieving factors: walking  PRECAUTIONS: None  WEIGHT BEARING RESTRICTIONS No  FALLS:  Has patient fallen in last 6 months? No  LIVING ENVIRONMENT: Lives with: lives with their family Lives in: House/apartment  OCCUPATION: cycle instructor   PLOF: Independent  PATIENT GOALS to have less pain  PERTINENT HISTORY:  C-section 05/09/21; Anxiety, History of cancer  Hypertension , NEPHRECTOMY PARTIAL , allergic to tape, given high risk pregnancy with severe IUGR and gHTN.  Sexual abuse: No  BOWEL MOVEMENT Pain with bowel movement: No Type of bowel movement:Type (Bristol Stool Scale) 4, Frequency daily, and Strain No Fully empty rectum: Yes:   Leakage: No Pads: No Fiber supplement: No  URINATION Pain with urination: No Fully empty bladder: Yes:   Stream: Strong Urgency: No Frequency: sometimes urinates around 2 hours, sometimes nightly 1x but only if she drinks a lot of water before bed Leakage:  none Pads: No  INTERCOURSE Pain with intercourse: After Intercourse (after climax) Ability to have vaginal penetration:  Yes:   Climax: not painful during but sometimes after Marinoff Scale: 0/3  PREGNANCY Vaginal deliveries 0 Tearing No C-section deliveries 1 Currently pregnant No  PROLAPSE None    OBJECTIVE:   DIAGNOSTIC FINDINGS:  PFIO-7:  14  COGNITION:  Overall cognitive status: Within functional limits for tasks assessed     SENSATION:  Light touch: Appears intact  Proprioception: Appears intact  MUSCLE LENGTH: Hamstrings and adductors limited by 25%                POSTURE: rounded shoulders and anterior pelvic tilt   LUMBARAROM/PROM  A/PROM A/PROM  eval  Flexion Limited by 25%  Extension Limited by 25%  Right lateral flexion Limited by 25%  Left lateral flexion WFL  Right rotation Limited by 25%  Left rotation Limited by 25%   (Blank rows = not tested)  LOWER EXTREMITY ROM:  WFL bil   LOWER EXTREMITY MMT:  Bil hips 4+/5; knees and ankles WFL   PALPATION:   General  no TTP throughout abdomen but does have TTP at and around c-section scar.   Pt has limited mobility with tissue restrictions throughout c-section scar with Rt medial and Rt lateral quadrants the most restricted and pt reports more tender than Lt.                 External Perineal Exam pt deferred until completed period                              Internal Pelvic Floor deferred  Patient confirms identification and approves PT to assess internal pelvic floor and treatment Yes but not today as she is on her period and would like to wait until this is done  12/08/2021 pt deferred  12/12/21 yes - No emotional/communication barriers or cognitive limitation. Patient is motivated to learn. Patient understands and agrees with treatment goals and plan. PT explains patient will be examined in standing, sitting, and lying down to see how their muscles and joints work. When they are ready, they will be asked to remove their underwear so PT can examine their perineum. The patient is also given the option of providing their own chaperone as one is not provided in our facility. The patient also has the right and is explained the right to defer or refuse any part of the evaluation or treatment including the internal  exam. With the patient's consent, PT will  use one gloved finger to gently assess the muscles of the pelvic floor, seeing how well it contracts and relaxes and if there is muscle symmetry. After, the patient will get dressed and PT and patient will discuss exam findings and plan of care. PT and patient discuss plan of care, schedule, attendance policy and HEP activities.   PELVIC MMT:   MMT eval  Vaginal 4/5; 7s; 7 reps  Internal Anal Sphincter   External Anal Sphincter   Puborectalis   Diastasis Recti   (Blank rows = not tested)        TONE: 12/12/21 WFL  PROLAPSE: 08/29/23not seen in hooklying   TODAY'S TREATMENT   12/28/2021: Manual: at Lt side of c-section scar and above scar for improved mobility, noted tension here  with TTP felt by pt tolerated well and denied tension with end of manual fascial release X10 half kneel 8#diagonals X10 standing marching with 8# shoulder height  2x10 squats 8# with OHP   PATIENT EDUCATION:  Education details: [id number] Person educated: Patient Education method: Consulting civil engineer, Demonstration, Tactile cues, Verbal cues, and Handouts Education comprehension: verbalized understanding and returned demonstration   HOME EXERCISE PROGRAM: [id number]  ASSESSMENT:  CLINICAL IMPRESSION: Patient reports she feels so much better, almost no pain but when it does occur it is very minimal and she is able to do cobra stretch and it resolves. Pt has been doing scar mobility and HEP at home denies any questions about this and reports it has been doing well did add a thoracic opening stretch pt demonstrated one rep with good technique. Pt session focused on manual at Lt side of abdomen/scar for improved mobility with this are being only area of tightness noted. Pt then directed in core strengthening with good technique without pain and good activation noted. Pt reports she feels confident with DC after session today and denies concerns or questions, all goals met.     OBJECTIVE IMPAIRMENTS decreased  endurance, decreased mobility, decreased strength, increased fascial restrictions, increased muscle spasms, impaired flexibility, improper body mechanics, postural dysfunction, and pain.   ACTIVITY LIMITATIONS carrying, lifting, and locomotion level  PARTICIPATION LIMITATIONS: interpersonal relationship and community activity  PERSONAL FACTORS Past/current experiences, Time since onset of injury/illness/exacerbation, and 1 comorbidity: recent c-section and medical history  are also affecting patient's functional outcome.   REHAB POTENTIAL: Good  CLINICAL DECISION MAKING: Stable/uncomplicated  EVALUATION COMPLEXITY: Low   GOALS: Goals reviewed with patient? Yes  SHORT TERM GOALS: Target date: 12/05/2021  Pt to be I with HEP.  Baseline: Goal status: MET  2.  Pt to demonstrate at least 3/5 pelvic floor strength for improved pelvic stability and decreased strain at pelvic floor Baseline:  Goal status: MET  3.  Pt to be I with scar mobility and TA activations in supine or hooklying for improved tissue mobility and core strengthening.  Baseline:  Goal status: MET   LONG TERM GOALS: Target date:  02/07/22    Pt to be I with advanced HEP.  Baseline:  Goal status: MET  2.  Pt to demonstrate at least 5/5 bil hip strength for improved pelvic stability and functional squats of at least 25# without pain.  Baseline:  Goal status: MET  3.  Pt to demonstrate at least 4/5 pelvic floor strength for improved pelvic stability and decreased strain at pelvic floor. Baseline:  Goal status: MET  4.  Pt to report no more than 2/10 pain at bil groin due  to improved tissue mobility and core strengthening for improved tolerance to exercise.  Baseline:  Goal status: MET   PLAN: PT FREQUENCY: 1x/week  PT DURATION:  8 sessions  PLANNED INTERVENTIONS: Therapeutic exercises, Therapeutic activity, Neuromuscular re-education, Balance training, Self Care, Joint mobilization, Aquatic Therapy, Dry  Needling, Spinal mobilization, Cryotherapy, Moist heat, scar mobilization, Taping, Biofeedback, and Manual therapy  PLAN FOR NEXT SESSION: internal if needed and pt consents, manual at spine and abdomen, core activations and coordination of breathing and pelvic floor mobility with strengthening   PHYSICAL THERAPY DISCHARGE SUMMARY  Visits from Start of Care: 5  Current functional level related to goals / functional outcomes: All goals met   Remaining deficits: All goals met   Education / Equipment: HEP   Patient agrees to discharge. Patient goals were met. Patient is being discharged due to meeting the stated rehab goals. Thank you for the referral.    Stacy Gardner, PT, DPT 09/14/233:31 PM

## 2022-01-22 ENCOUNTER — Other Ambulatory Visit: Payer: Self-pay

## 2022-01-22 ENCOUNTER — Encounter (HOSPITAL_BASED_OUTPATIENT_CLINIC_OR_DEPARTMENT_OTHER): Payer: Self-pay | Admitting: General Surgery

## 2022-01-22 ENCOUNTER — Ambulatory Visit: Payer: Self-pay | Admitting: General Surgery

## 2022-01-22 NOTE — Progress Notes (Signed)
   01/22/22 1547  PAT Phone Screen  Do You Have Diabetes? No  Do You Have Hypertension? No (Pre eclampsia)  Have You Ever Been to the ER for Asthma? No  Have You Taken Oral Steroids in the Past 3 Months? No  Do you Take Phenteramine or any Other Diet Drugs? No  Recent  Lab Work, EKG, CXR? Yes  Where was this test performed? (S)  08/15/21 EKG NSR - OV with Dr Harrell Gave for palpitations - Pt had pre eclampsia with pregnancy (delivery 05/09/21) .F/U if needed if symptoms persist.  At the time of the pre op phone call pt denies any CP/SOB or palpitations. Not needing labetelol prn.  Do you have a history of heart problems? (S)  Cardiologist (Dr Harrell Gave)  Have You Ever Had Tests on Your Heart? No  Any Recent Hospitalizations? No  Height '5\' 6"'$  (1.676 m)  Weight 71.7 kg  Pat Appointment Scheduled No  Reason for No Appointment Not Needed

## 2022-01-25 MED ORDER — ENSURE PRE-SURGERY PO LIQD: 296.0000 mL | Freq: Once | ORAL | Status: DC

## 2022-01-25 NOTE — Progress Notes (Signed)

## 2022-01-29 MED ORDER — SODIUM CHLORIDE 0.9 % IV SOLN
2.0000 g | INTRAVENOUS | Status: AC
  Filled 2022-01-29: qty 2

## 2022-01-29 NOTE — Anesthesia Preprocedure Evaluation (Signed)
Anesthesia Evaluation  Patient identified by MRN, date of birth, ID band Patient awake    Reviewed: Allergy & Precautions, NPO status , Patient's Chart, lab work & pertinent test results  Airway Mallampati: II  TM Distance: >3 FB Neck ROM: Full    Dental no notable dental hx.    Pulmonary asthma ,    Pulmonary exam normal        Cardiovascular hypertension, Pt. on medications and Pt. on home beta blockers  Rhythm:Regular Rate:Normal     Neuro/Psych Anxiety Depression Bipolar Disorder    GI/Hepatic negative GI ROS, Neg liver ROS,   Endo/Other  negative endocrine ROS  Renal/GU   negative genitourinary   Musculoskeletal negative musculoskeletal ROS (+)   Abdominal Normal abdominal exam  (+)   Peds  Hematology negative hematology ROS (+)   Anesthesia Other Findings   Reproductive/Obstetrics                            Anesthesia Physical Anesthesia Plan  ASA: 2  Anesthesia Plan: General   Post-op Pain Management:    Induction: Intravenous  PONV Risk Score and Plan: 3 and Ondansetron, Dexamethasone, Midazolam and Treatment may vary due to age or medical condition  Airway Management Planned: Mask and LMA  Additional Equipment: None  Intra-op Plan:   Post-operative Plan: Extubation in OR  Informed Consent: I have reviewed the patients History and Physical, chart, labs and discussed the procedure including the risks, benefits and alternatives for the proposed anesthesia with the patient or authorized representative who has indicated his/her understanding and acceptance.     Dental advisory given  Plan Discussed with: CRNA  Anesthesia Plan Comments:        Anesthesia Quick Evaluation

## 2022-01-30 ENCOUNTER — Other Ambulatory Visit: Payer: Self-pay

## 2022-01-30 ENCOUNTER — Ambulatory Visit (HOSPITAL_BASED_OUTPATIENT_CLINIC_OR_DEPARTMENT_OTHER): Payer: Medicaid Other | Admitting: Anesthesiology

## 2022-01-30 ENCOUNTER — Encounter (HOSPITAL_BASED_OUTPATIENT_CLINIC_OR_DEPARTMENT_OTHER): Payer: Self-pay | Admitting: General Surgery

## 2022-01-30 ENCOUNTER — Ambulatory Visit (HOSPITAL_BASED_OUTPATIENT_CLINIC_OR_DEPARTMENT_OTHER)
Admission: RE | Admit: 2022-01-30 | Discharge: 2022-01-30 | Disposition: A | Discharge status: Home / Self Care | Payer: Medicaid Other | Attending: General Surgery | Admitting: General Surgery

## 2022-01-30 ENCOUNTER — Encounter (HOSPITAL_BASED_OUTPATIENT_CLINIC_OR_DEPARTMENT_OTHER)
Admission: RE | Disposition: A | Discharge status: Home / Self Care | Payer: Self-pay | Source: Home / Self Care | Attending: General Surgery

## 2022-01-30 DIAGNOSIS — K644 Residual hemorrhoidal skin tags: Secondary | ICD-10-CM | POA: Diagnosis present

## 2022-01-30 DIAGNOSIS — J45909 Unspecified asthma, uncomplicated: Secondary | ICD-10-CM | POA: Insufficient documentation

## 2022-01-30 DIAGNOSIS — Z79899 Other long term (current) drug therapy: Secondary | ICD-10-CM | POA: Diagnosis not present

## 2022-01-30 DIAGNOSIS — F319 Bipolar disorder, unspecified: Secondary | ICD-10-CM | POA: Diagnosis not present

## 2022-01-30 DIAGNOSIS — Z01818 Encounter for other preprocedural examination: Secondary | ICD-10-CM

## 2022-01-30 DIAGNOSIS — F419 Anxiety disorder, unspecified: Secondary | ICD-10-CM | POA: Diagnosis not present

## 2022-01-30 HISTORY — PX: EXCISION OF SKIN TAG: SHX6270

## 2022-01-30 LAB — POCT PREGNANCY, URINE: Preg Test, Ur: NEGATIVE

## 2022-01-30 SURGERY — EXCISION, SKIN TAG
Anesthesia: General | Site: Rectum

## 2022-01-30 MED ORDER — LIDOCAINE-EPINEPHRINE (PF) 1 %-1:200000 IJ SOLN
INTRAMUSCULAR | Status: AC
  Filled 2022-01-30: qty 60

## 2022-01-30 MED ORDER — GABAPENTIN 300 MG PO CAPS
ORAL_CAPSULE | ORAL | Status: AC
  Filled 2022-01-30: qty 1

## 2022-01-30 MED ORDER — MIDAZOLAM HCL 2 MG/2ML IJ SOLN
INTRAMUSCULAR | Status: AC
  Filled 2022-01-30: qty 2

## 2022-01-30 MED ORDER — LIDOCAINE 2% (20 MG/ML) 5 ML SYRINGE
INTRAMUSCULAR | Status: DC | PRN
  Administered 2022-01-30: 40 mg via INTRAVENOUS

## 2022-01-30 MED ORDER — GABAPENTIN 300 MG PO CAPS
300.0000 mg | ORAL_CAPSULE | ORAL | Status: AC
  Administered 2022-01-30: 300 mg via ORAL

## 2022-01-30 MED ORDER — EPHEDRINE 5 MG/ML INJ
INTRAVENOUS | Status: AC
  Filled 2022-01-30: qty 5

## 2022-01-30 MED ORDER — LACTATED RINGERS IV SOLN: INTRAVENOUS | Status: DC

## 2022-01-30 MED ORDER — OXYCODONE HCL 5 MG PO TABS: 5.0000 mg | ORAL_TABLET | Freq: Four times a day (QID) | ORAL | 0 refills | Status: DC | PRN

## 2022-01-30 MED ORDER — BUPIVACAINE LIPOSOME 1.3 % IJ SUSP: 20.0000 mL | Freq: Once | INTRAMUSCULAR | Status: DC

## 2022-01-30 MED ORDER — MIDAZOLAM HCL 5 MG/5ML IJ SOLN
INTRAMUSCULAR | Status: DC | PRN
  Administered 2022-01-30: 2 mg via INTRAVENOUS

## 2022-01-30 MED ORDER — ACETAMINOPHEN 500 MG PO TABS
1000.0000 mg | ORAL_TABLET | ORAL | Status: AC
  Administered 2022-01-30: 1000 mg via ORAL

## 2022-01-30 MED ORDER — PHENYLEPHRINE 80 MCG/ML (10ML) SYRINGE FOR IV PUSH (FOR BLOOD PRESSURE SUPPORT)
PREFILLED_SYRINGE | INTRAVENOUS | Status: AC
  Filled 2022-01-30: qty 10

## 2022-01-30 MED ORDER — CHLORHEXIDINE GLUCONATE CLOTH 2 % EX PADS: 6.0000 | MEDICATED_PAD | Freq: Once | CUTANEOUS | Status: DC

## 2022-01-30 MED ORDER — BUPIVACAINE-EPINEPHRINE (PF) 0.5% -1:200000 IJ SOLN
INTRAMUSCULAR | Status: AC
  Filled 2022-01-30: qty 30

## 2022-01-30 MED ORDER — SODIUM CHLORIDE 0.9 % IV SOLN
INTRAVENOUS | Status: DC | PRN
  Administered 2022-01-30: 2 g via INTRAVENOUS

## 2022-01-30 MED ORDER — SUCCINYLCHOLINE CHLORIDE 200 MG/10ML IV SOSY
PREFILLED_SYRINGE | INTRAVENOUS | Status: AC
  Filled 2022-01-30: qty 10

## 2022-01-30 MED ORDER — BUPIVACAINE LIPOSOME 1.3 % IJ SUSP
INTRAMUSCULAR | Status: DC | PRN
  Administered 2022-01-30: 20 mL

## 2022-01-30 MED ORDER — ATROPINE SULFATE 0.4 MG/ML IV SOLN
INTRAVENOUS | Status: AC
  Filled 2022-01-30: qty 1

## 2022-01-30 MED ORDER — HEPARIN (PORCINE) IN NACL 1000-0.9 UT/500ML-% IV SOLN
INTRAVENOUS | Status: AC
  Filled 2022-01-30: qty 500

## 2022-01-30 MED ORDER — AMISULPRIDE (ANTIEMETIC) 5 MG/2ML IV SOLN: 10.0000 mg | Freq: Once | INTRAVENOUS | Status: DC | PRN

## 2022-01-30 MED ORDER — LIDOCAINE 2% (20 MG/ML) 5 ML SYRINGE
INTRAMUSCULAR | Status: AC
  Filled 2022-01-30: qty 5

## 2022-01-30 MED ORDER — ACETAMINOPHEN 500 MG PO TABS
ORAL_TABLET | ORAL | Status: AC
  Filled 2022-01-30: qty 2

## 2022-01-30 MED ORDER — BUPIVACAINE-EPINEPHRINE 0.5% -1:200000 IJ SOLN
INTRAMUSCULAR | Status: DC | PRN
  Administered 2022-01-30: 20 mL

## 2022-01-30 MED ORDER — BUPIVACAINE LIPOSOME 1.3 % IJ SUSP
INTRAMUSCULAR | Status: AC
  Filled 2022-01-30: qty 20

## 2022-01-30 MED ORDER — BUPIVACAINE HCL (PF) 0.25 % IJ SOLN
INTRAMUSCULAR | Status: AC
  Filled 2022-01-30: qty 150

## 2022-01-30 MED ORDER — FENTANYL CITRATE (PF) 100 MCG/2ML IJ SOLN: 25.0000 ug | INTRAMUSCULAR | Status: DC | PRN

## 2022-01-30 MED ORDER — PROPOFOL 10 MG/ML IV BOLUS
INTRAVENOUS | Status: DC | PRN
  Administered 2022-01-30: 200 mg via INTRAVENOUS

## 2022-01-30 MED ORDER — FENTANYL CITRATE (PF) 100 MCG/2ML IJ SOLN
INTRAMUSCULAR | Status: DC | PRN
  Administered 2022-01-30: 100 ug via INTRAVENOUS

## 2022-01-30 MED ORDER — DEXAMETHASONE SODIUM PHOSPHATE 10 MG/ML IJ SOLN
INTRAMUSCULAR | Status: AC
  Filled 2022-01-30: qty 1

## 2022-01-30 MED ORDER — FENTANYL CITRATE (PF) 100 MCG/2ML IJ SOLN
INTRAMUSCULAR | Status: AC
  Filled 2022-01-30: qty 2

## 2022-01-30 MED ORDER — ONDANSETRON HCL 4 MG/2ML IJ SOLN
INTRAMUSCULAR | Status: AC
  Filled 2022-01-30: qty 2

## 2022-01-30 MED ORDER — ONDANSETRON HCL 4 MG/2ML IJ SOLN
INTRAMUSCULAR | Status: DC | PRN
  Administered 2022-01-30: 4 mg via INTRAVENOUS

## 2022-01-30 MED ORDER — HEPARIN SOD (PORK) LOCK FLUSH 100 UNIT/ML IV SOLN
INTRAVENOUS | Status: AC
  Filled 2022-01-30: qty 5

## 2022-01-30 SURGICAL SUPPLY — 60 items
ADH SKN CLS APL DERMABOND .7 (GAUZE/BANDAGES/DRESSINGS)
APL PRP STRL LF DISP 70% ISPRP (MISCELLANEOUS)
APL SKNCLS STERI-STRIP NONHPOA (GAUZE/BANDAGES/DRESSINGS)
BENZOIN TINCTURE PRP APPL 2/3 (GAUZE/BANDAGES/DRESSINGS) IMPLANT
BLADE CLIPPER SURG (BLADE) IMPLANT
BLADE HEX COATED 2.75 (ELECTRODE) ×1 IMPLANT
BLADE SURG 15 STRL LF DISP TIS (BLADE) ×1 IMPLANT
BLADE SURG 15 STRL SS (BLADE) ×1
CANISTER SUCT 1200ML W/VALVE (MISCELLANEOUS) IMPLANT
CHLORAPREP W/TINT 26 (MISCELLANEOUS) ×1 IMPLANT
COVER BACK TABLE 60X90IN (DRAPES) ×1 IMPLANT
COVER MAYO STAND STRL (DRAPES) ×1 IMPLANT
DERMABOND ADVANCED .7 DNX12 (GAUZE/BANDAGES/DRESSINGS) IMPLANT
DRAPE LAPAROTOMY 100X72 PEDS (DRAPES) ×1 IMPLANT
DRAPE U-SHAPE 76X120 STRL (DRAPES) IMPLANT
DRAPE UTILITY XL STRL (DRAPES) ×1 IMPLANT
ELECT REM PT RETURN 9FT ADLT (ELECTROSURGICAL) ×1
ELECTRODE REM PT RTRN 9FT ADLT (ELECTROSURGICAL) ×1 IMPLANT
GAUZE SPONGE 4X4 12PLY STRL (GAUZE/BANDAGES/DRESSINGS) IMPLANT
GAUZE SPONGE 4X4 12PLY STRL LF (GAUZE/BANDAGES/DRESSINGS) ×1 IMPLANT
GLOVE BIO SURGEON STRL SZ8 (GLOVE) ×1 IMPLANT
GLOVE BIOGEL PI IND STRL 7.0 (GLOVE) IMPLANT
GLOVE BIOGEL PI IND STRL 8 (GLOVE) ×1 IMPLANT
GOWN STRL REUS W/ TWL LRG LVL3 (GOWN DISPOSABLE) ×1 IMPLANT
GOWN STRL REUS W/ TWL XL LVL3 (GOWN DISPOSABLE) ×1 IMPLANT
GOWN STRL REUS W/TWL LRG LVL3 (GOWN DISPOSABLE) ×1
GOWN STRL REUS W/TWL XL LVL3 (GOWN DISPOSABLE) ×1
NDL HYPO 25X1 1.5 SAFETY (NEEDLE) ×1 IMPLANT
NDL HYPO 27GX1-1/4 (NEEDLE) IMPLANT
NEEDLE HYPO 25X1 1.5 SAFETY (NEEDLE) ×1 IMPLANT
NEEDLE HYPO 27GX1-1/4 (NEEDLE) IMPLANT
NS IRRIG 1000ML POUR BTL (IV SOLUTION) IMPLANT
PACK BASIN DAY SURGERY FS (CUSTOM PROCEDURE TRAY) ×1 IMPLANT
PACK LITHOTOMY IV (CUSTOM PROCEDURE TRAY) IMPLANT
PENCIL SMOKE EVACUATOR (MISCELLANEOUS) ×1 IMPLANT
SHEARS HARMONIC 9CM CVD (BLADE) IMPLANT
SHEET MEDIUM DRAPE 40X70 STRL (DRAPES) IMPLANT
SLEEVE SCD COMPRESS KNEE MED (STOCKING) IMPLANT
SPIKE FLUID TRANSFER (MISCELLANEOUS) ×1 IMPLANT
SPONGE T-LAP 18X18 ~~LOC~~+RFID (SPONGE) IMPLANT
SPONGE T-LAP 4X18 ~~LOC~~+RFID (SPONGE) IMPLANT
STRIP CLOSURE SKIN 1/2X4 (GAUZE/BANDAGES/DRESSINGS) IMPLANT
SUT ETHILON 3 0 PS 1 (SUTURE) IMPLANT
SUT ETHILON 5 0 PS 2 18 (SUTURE) IMPLANT
SUT MON AB 4-0 PC3 18 (SUTURE) IMPLANT
SUT SILK 2 0 PERMA HAND 18 BK (SUTURE) IMPLANT
SUT VIC AB 3-0 PS1 18 (SUTURE)
SUT VIC AB 3-0 PS1 18XBRD (SUTURE) IMPLANT
SUT VIC AB 3-0 SH 27 (SUTURE)
SUT VIC AB 3-0 SH 27X BRD (SUTURE) IMPLANT
SUT VIC AB 4-0 SH 18 (SUTURE) IMPLANT
SUT VIC AB 4-0 SH 27 (SUTURE)
SUT VIC AB 4-0 SH 27XANBCTRL (SUTURE) IMPLANT
SUT VIC AB 5-0 PS2 18 (SUTURE) IMPLANT
SYR BULB EAR ULCER 3OZ GRN STR (SYRINGE) IMPLANT
SYR CONTROL 10ML LL (SYRINGE) ×1 IMPLANT
TOWEL GREEN STERILE FF (TOWEL DISPOSABLE) ×2 IMPLANT
TRAY DSU PREP LF (CUSTOM PROCEDURE TRAY) IMPLANT
TUBE CONNECTING 20X1/4 (TUBING) IMPLANT
YANKAUER SUCT BULB TIP NO VENT (SUCTIONS) IMPLANT

## 2022-01-30 NOTE — Anesthesia Postprocedure Evaluation (Signed)
Anesthesia Post Note  Patient: Melanie Cooley  Procedure(s) Performed: EXCISION OF ANAL SKIN TAG (Rectum)     Patient location during evaluation: PACU Anesthesia Type: General Level of consciousness: awake and alert Pain management: pain level controlled Vital Signs Assessment: post-procedure vital signs reviewed and stable Respiratory status: spontaneous breathing, nonlabored ventilation, respiratory function stable and patient connected to nasal cannula oxygen Cardiovascular status: blood pressure returned to baseline and stable Postop Assessment: no apparent nausea or vomiting Anesthetic complications: no   No notable events documented.  Last Vitals:  Vitals:   01/30/22 0845 01/30/22 0901  BP: 103/63 125/81  Pulse: 87 83  Resp: (!) 0 14  Temp:  (!) 36.3 C  SpO2: 100% 98%    Last Pain:  Vitals:   01/30/22 0901  TempSrc: Oral  PainSc: 0-No pain                 Belenda Cruise P Thedore Pickel

## 2022-01-30 NOTE — Transfer of Care (Signed)
Immediate Anesthesia Transfer of Care Note  Patient: Melanie Cooley  Procedure(s) Performed: EXCISION OF ANAL SKIN TAG (Rectum)  Patient Location: PACU  Anesthesia Type:General  Level of Consciousness: sedated  Airway & Oxygen Therapy: Patient Spontanous Breathing and Patient connected to face mask oxygen  Post-op Assessment: Report given to RN and Post -op Vital signs reviewed and stable  Post vital signs: Reviewed and stable  Last Vitals:  Vitals Value Taken Time  BP 93/51 01/30/22 0821  Temp    Pulse 75 01/30/22 0822  Resp 14 01/30/22 0822  SpO2 100 % 01/30/22 0822  Vitals shown include unvalidated device data.  Last Pain:  Vitals:   01/30/22 0647  TempSrc: Oral  PainSc: 0-No pain         Complications: No notable events documented.

## 2022-01-30 NOTE — Op Note (Signed)
  01/30/2022  8:17 AM  PATIENT:  Melanie Cooley  36 y.o. female  PRE-OPERATIVE DIAGNOSIS:  ANAL SKIN TAG  POST-OPERATIVE DIAGNOSIS:  ANAL SKIN TAG  PROCEDURE:  Procedure(s): EXAMINATION UNDER ANESTHESIA EXCISION OF ANAL SKIN TAG  SURGEON:  Surgeon(s): Georganna Skeans, MD  ASSISTANTS: none   ANESTHESIA:   local and general  EBL:  Total I/O In: 800 [I.V.:800] Out: 5 [Blood:5]  BLOOD ADMINISTERED:none  DRAINS: none   SPECIMEN:  Excision  DISPOSITION OF SPECIMEN:  PATHOLOGY  COUNTS:  YES  DICTATION: .Dragon Dictation Procedure in detail: Informed consent was obtained.  She received intravenous antibiotics.  She was brought to the operating room and general anesthesia with laryngeal mask airway was administered by the anesthesia staff.  She was placed in lithotomy position and the perianal region was prepped and draped in sterile fashion.  We did a timeout procedure.  I did perianal block with a 50/50 mixture of Exparel and local with epinephrine.  I then proceeded with examination under anesthesia.  There were no palpable or visible rectal lesions with the exception of a scarred down internal hemorrhoid.  She is status post banding in our office in the past.  No other abnormalities were noted.  The main skin tag was posterior and rather large.  This was excised with a harmonic scalpel staying superficial to the sphincters.  It was sent to pathology.  I then used cautery to get excellent hemostasis.  This completed the procedure.  A sterile dressing was applied.  All counts were correct.  She tolerated the procedure without apparent complications and was taken recovery in stable condition. PATIENT DISPOSITION:  PACU - hemodynamically stable.   Delay start of Pharmacological VTE agent (>24hrs) due to surgical blood loss or risk of bleeding:  no  Georganna Skeans, MD, MPH, FACS Pager: [phone]  10/17/20238:17 AM

## 2022-01-30 NOTE — Discharge Instructions (Signed)
No Tylenol until after 4pm today if needed   Post Anesthesia Home Care Instructions  Activity: Get plenty of rest for the remainder of the day. A responsible individual must stay with you for 24 hours following the procedure.  For the next 24 hours, DO NOT: -Drive a car -Paediatric nurse -Drink alcoholic beverages -Take any medication unless instructed by your physician -Make any legal decisions or sign important papers.  Meals: Start with liquid foods such as gelatin or soup. Progress to regular foods as tolerated. Avoid greasy, spicy, heavy foods. If nausea and/or vomiting occur, drink only clear liquids until the nausea and/or vomiting subsides. Call your physician if vomiting continues.  Special Instructions/Symptoms: Your throat may feel dry or sore from the anesthesia or the breathing tube placed in your throat during surgery. If this causes discomfort, gargle with warm salt water. The discomfort should disappear within 24 hours.  If you had a scopolamine patch placed behind your ear for the management of post- operative nausea and/or vomiting:  1. The medication in the patch is effective for 72 hours, after which it should be removed.  Wrap patch in a tissue and discard in the trash. Wash hands thoroughly with soap and water. 2. You may remove the patch earlier than 72 hours if you experience unpleasant side effects which may include dry mouth, dizziness or visual disturbances. 3. Avoid touching the patch. Wash your hands with soap and water after contact with the patch.

## 2022-01-30 NOTE — Anesthesia Procedure Notes (Signed)
Procedure Name: LMA Insertion Date/Time: 01/30/2022 7:46 AM  Performed by: Willa Frater, CRNAPre-anesthesia Checklist: Patient identified, Emergency Drugs available, Suction available and Patient being monitored Patient Re-evaluated:Patient Re-evaluated prior to induction Oxygen Delivery Method: Circle system utilized Preoxygenation: Pre-oxygenation with 100% oxygen Induction Type: IV induction Ventilation: Mask ventilation without difficulty LMA: LMA inserted LMA Size: 4.0 Number of attempts: 1 Airway Equipment and Method: Bite block Placement Confirmation: positive ETCO2 Tube secured with: Tape Dental Injury: Teeth and Oropharynx as per pre-operative assessment

## 2022-01-30 NOTE — H&P (Signed)
Melanie Cooley is an 36 y.o. female.   Chief Complaint: Anal skin tags HPI: Patient is status post banding of symptomatic internal hemorrhoid in the past.  Those symptoms have improved but she continues to have ongoing problems with large external anal skin tags.  She presents for excision.  No significant changes since I saw her in the office recently.  Past Medical History:  Diagnosis Date   Anxiety    Depression    Preeclampsia 2023   Renal cell carcinoma ([place]) 2017    Past Surgical History:  Procedure Laterality Date   CESAREAN SECTION N/A 05/09/2021   Procedure: Primary CESAREAN SECTION;  Surgeon: Azucena Fallen, MD;  Location: [facility];  Service: Obstetrics;  Laterality: N/A;  EDD: 06/09/21   INDUCED ABORTION     NEPHRECTOMY Right 2017   Partial due to cancer    Family History  Problem Relation Age of Onset   Hyperlipidemia Father    Cancer Maternal Grandmother    Cancer Maternal Grandfather    Mesothelioma Paternal Grandmother    Social History:  reports that she has never smoked. She has never used smokeless tobacco. She reports that she does not drink alcohol and does not use drugs.  Allergies:  Allergies  Allergen Reactions   Compazine [Prochlorperazine] Other (See Comments)    psychosis   Tape Other (See Comments)    Burning, rash   Betadine [Povidone-Iodine] Rash    Medications Prior to Admission  Medication Sig Dispense Refill   ibuprofen (ADVIL) 600 MG tablet Take 1 tablet (600 mg total) by mouth every 6 (six) hours. 30 tablet 0   magnesium gluconate (MAGONATE) 500 MG tablet Take 500 mg by mouth daily.     albuterol (VENTOLIN HFA) 108 (90 Base) MCG/ACT inhaler Inhale into the lungs every 6 (six) hours as needed for wheezing or shortness of breath.     labetalol (NORMODYNE) 100 MG tablet Take 50 mg by mouth as needed (palpitations/ elevated BP).      Results for orders placed or performed during the hospital encounter of 01/30/22 (from the past 48 hour(s))   Pregnancy, urine POC     Status: None   Collection Time: 01/30/22  6:39 AM  Result Value Ref Range   Preg Test, Ur NEGATIVE NEGATIVE    Comment:        THE SENSITIVITY OF THIS METHODOLOGY IS >24 mIU/mL    No results found.  Review of Systems  Height '5\' 6"'$  (1.676 m), weight 71.7 kg, last menstrual period 01/22/2022, not currently breastfeeding. Physical Exam Eyes:     Pupils: Pupils are equal, round, and reactive to light.  Cardiovascular:     Rate and Rhythm: Normal rate and regular rhythm.  Pulmonary:     Effort: Pulmonary effort is normal.     Breath sounds: Normal breath sounds.  Abdominal:     General: Abdomen is flat.     Palpations: Abdomen is soft.  Genitourinary:    Comments: As prev Neurological:     Mental Status: She is alert and oriented to person, place, and time.      Assessment/Plan Anal skin tags -for excision as an outpatient surgical procedure today.  Procedure, risks, and benefits were again discussed.  I discussed the expected postoperative course.  She is agreeable.  Zenovia Jarred, MD 01/30/2022, 6:42 AM

## 2022-01-31 ENCOUNTER — Encounter (HOSPITAL_BASED_OUTPATIENT_CLINIC_OR_DEPARTMENT_OTHER): Payer: Self-pay | Admitting: General Surgery

## 2022-01-31 LAB — SURGICAL PATHOLOGY

## 2022-03-09 ENCOUNTER — Ambulatory Visit
Admission: RE | Admit: 2022-03-09 | Discharge: 2022-03-09 | Disposition: A | Discharge status: Home / Self Care | Payer: Medicaid Other | Source: Ambulatory Visit | Attending: Urgent Care | Admitting: Urgent Care

## 2022-03-09 VITALS — BP 127/87 | HR 84 | Temp 99.0°F | Resp 20

## 2022-03-09 DIAGNOSIS — R350 Frequency of micturition: Secondary | ICD-10-CM | POA: Insufficient documentation

## 2022-03-09 DIAGNOSIS — R1909 Other intra-abdominal and pelvic swelling, mass and lump: Secondary | ICD-10-CM | POA: Diagnosis not present

## 2022-03-09 DIAGNOSIS — L03818 Cellulitis of other sites: Secondary | ICD-10-CM | POA: Insufficient documentation

## 2022-03-09 LAB — POCT URINALYSIS DIP (MANUAL ENTRY)
Bilirubin, UA: NEGATIVE
Blood, UA: NEGATIVE
Glucose, UA: NEGATIVE mg/dL
Ketones, POC UA: NEGATIVE mg/dL
Leukocytes, UA: NEGATIVE
Nitrite, UA: NEGATIVE
Protein Ur, POC: NEGATIVE mg/dL
Spec Grav, UA: >=1.03 — AB (ref 1.010–1.025)
Urobilinogen, UA: 0.2 E.U./dL
pH, UA: 6 (ref 5.0–8.0)

## 2022-03-09 LAB — POCT URINE PREGNANCY: Preg Test, Ur: NEGATIVE

## 2022-03-09 MED ORDER — CEPHALEXIN 500 MG PO CAPS: 500.0000 mg | ORAL_CAPSULE | Freq: Three times a day (TID) | ORAL | 0 refills | Status: DC

## 2022-03-09 NOTE — ED Triage Notes (Signed)
Pt c/o urinary freq x 2-3 days, swelling to right groin x 1 week-NAD-steady gait

## 2022-03-09 NOTE — ED Provider Notes (Signed)
[facility]  Note:  This document was prepared using Systems analyst and may include unintentional dictation errors.  MRN: [id number] DOB: [birth year]  Subjective:   Melanie Cooley is a 36 y.o. female presenting for 1 week history of acute onset right groin swelling and slight redness.  Has also had 2 to 3-day history of urinary frequency.  No drainage of pus or bleeding.  No dysuria, hematuria, vaginal discharge.  Patient has been doing some bike riding and thinks that might be related to the groin area.  Has not had lymph node swelling in this area but is considering that as well.  Reports that she tries to hydrate well.  She is in a monogamous relationship with her husband.  They were not as sexually active for the past 10 months but have started to become more sexually active and thinks that also might be related to what she is experiencing.  No current facility-administered medications for this encounter.  Current Outpatient Medications:    albuterol (VENTOLIN HFA) 108 (90 Base) MCG/ACT inhaler, Inhale into the lungs every 6 (six) hours as needed for wheezing or shortness of breath., Disp: , Rfl:    ibuprofen (ADVIL) 600 MG tablet, Take 1 tablet (600 mg total) by mouth every 6 (six) hours., Disp: 30 tablet, Rfl: 0   labetalol (NORMODYNE) 100 MG tablet, Take 50 mg by mouth as needed (palpitations/ elevated BP)., Disp: , Rfl:    magnesium gluconate (MAGONATE) 500 MG tablet, Take 500 mg by mouth daily., Disp: , Rfl:    Multiple Minerals (CHELATED MULTIPLE MINERAL PO), Take by mouth., Disp: , Rfl:    Multiple Vitamins-Minerals (ANTIOXIDANT PO), Take by mouth., Disp: , Rfl:    oxyCODONE (OXY IR/ROXICODONE) 5 MG immediate release tablet, Take 1 tablet (5 mg total) by mouth every 6 (six) hours as needed for severe pain., Disp: 20 tablet, Rfl: 0   Allergies  Allergen Reactions   Compazine [Prochlorperazine] Other (See Comments)    psychosis   Tape Other  (See Comments)    Burning, rash   Betadine [Povidone-Iodine] Rash    Past Medical History:  Diagnosis Date   Anxiety    Depression    Preeclampsia 2023   Renal cell carcinoma ([place]) 2017     Past Surgical History:  Procedure Laterality Date   CESAREAN SECTION N/A 05/09/2021   Procedure: Primary CESAREAN SECTION;  Surgeon: Azucena Fallen, MD;  Location: [facility];  Service: Obstetrics;  Laterality: N/A;  EDD: 06/09/21   EXCISION OF SKIN TAG N/A 01/30/2022   Procedure: EXCISION OF ANAL SKIN TAG;  Surgeon: Georganna Skeans, MD;  Location: [facility];  Service: General;  Laterality: N/A;   INDUCED ABORTION     NEPHRECTOMY Right 2017   Partial due to cancer    Family History  Problem Relation Age of Onset   Hyperlipidemia Father    Cancer Maternal Grandmother    Cancer Maternal Grandfather    Mesothelioma Paternal Grandmother     Social History   Tobacco Use   Smoking status: Never   Smokeless tobacco: Never  Vaping Use   Vaping Use: Never used  Substance Use Topics   Alcohol use: Never   Drug use: Never    ROS   Objective:   Vitals: BP 127/87 (BP Location: Right Arm)   Pulse 84   Temp 99 F (37.2 C) (Oral)   Resp 20   LMP 02/17/2022   SpO2 98%  Physical Exam Exam conducted with a chaperone present (RT [place]).  Constitutional:      General: She is not in acute distress.    Appearance: Normal appearance. She is well-developed. She is not ill-appearing, toxic-appearing or diaphoretic.  HENT:     Head: Normocephalic and atraumatic.     Nose: Nose normal.     Mouth/Throat:     Mouth: Mucous membranes are moist.  Eyes:     General: No scleral icterus.       Right eye: No discharge.        Left eye: No discharge.     Extraocular Movements: Extraocular movements intact.  Cardiovascular:     Rate and Rhythm: Normal rate.  Pulmonary:     Effort: Pulmonary effort is normal.  Genitourinary:   Skin:    General: Skin is warm and dry.   Neurological:     General: No focal deficit present.     Mental Status: She is alert and oriented to person, place, and time.  Psychiatric:        Mood and Affect: Mood normal.        Behavior: Behavior normal.     Results for orders placed or performed during the hospital encounter of 03/09/22 (from the past 24 hour(s))  POCT urinalysis dipstick     Status: Abnormal   Collection Time: 03/09/22  4:33 PM  Result Value Ref Range   Color, UA yellow yellow   Clarity, UA clear clear   Glucose, UA negative negative mg/dL   Bilirubin, UA negative negative   Ketones, POC UA negative negative mg/dL   Spec Grav, UA >=1.030 (A) 1.010 - 1.025   Blood, UA negative negative   pH, UA 6.0 5.0 - 8.0   Protein Ur, POC negative negative mg/dL   Urobilinogen, UA 0.2 0.2 or 1.0 E.U./dL   Nitrite, UA Negative Negative   Leukocytes, UA Negative Negative  POCT urine pregnancy     Status: None   Collection Time: 03/09/22  4:33 PM  Result Value Ref Range   Preg Test, Ur Negative Negative    Assessment and Plan :   PDMP not reviewed this encounter.  1. Urinary frequency   2. Groin swelling   3. Cellulitis of other specified site     Will cover for cellulitis of the area in question for the right groin With Keflex.  Low Suspicion for an Urinary Tract Infection but Will Order an Urine Culture.  Keflex can cover for this as well.  Recommended conservative management, consistent hydration. Counseled patient on potential for adverse effects with medications prescribed/recommended today, ER and return-to-clinic precautions discussed, patient verbalized understanding.    Jaynee Eagles, [place] 03/09/22 1853

## 2022-03-09 NOTE — Discharge Instructions (Signed)

## 2022-03-11 LAB — URINE CULTURE: Culture: <10000 — AB

## 2022-03-14 ENCOUNTER — Telehealth: Payer: Self-pay

## 2022-03-14 NOTE — Telephone Encounter (Signed)
Returned patients called-concerning medication question. No answer or voice mail to leave a message.

## 2022-05-02 ENCOUNTER — Ambulatory Visit: Payer: Self-pay

## 2022-06-13 ENCOUNTER — Other Ambulatory Visit: Payer: Self-pay | Admitting: Family Medicine

## 2022-06-13 DIAGNOSIS — C641 Malignant neoplasm of right kidney, except renal pelvis: Secondary | ICD-10-CM

## 2022-07-09 ENCOUNTER — Ambulatory Visit
Admission: RE | Admit: 2022-07-09 | Discharge: 2022-07-09 | Disposition: A | Discharge status: Home / Self Care | Payer: Medicaid Other | Source: Ambulatory Visit | Attending: Family Medicine | Admitting: Family Medicine

## 2022-07-09 DIAGNOSIS — C641 Malignant neoplasm of right kidney, except renal pelvis: Secondary | ICD-10-CM

## 2022-07-09 MED ORDER — GADOPICLENOL 0.5 MMOL/ML IV SOLN
7.0000 mL | Freq: Once | INTRAVENOUS | Status: AC | PRN
  Administered 2022-07-09: 7 mL via INTRAVENOUS

## 2023-03-06 ENCOUNTER — Other Ambulatory Visit: Payer: Self-pay

## 2023-03-06 ENCOUNTER — Encounter (HOSPITAL_BASED_OUTPATIENT_CLINIC_OR_DEPARTMENT_OTHER): Payer: Self-pay

## 2023-03-06 ENCOUNTER — Emergency Department (HOSPITAL_BASED_OUTPATIENT_CLINIC_OR_DEPARTMENT_OTHER)
Admission: EM | Admit: 2023-03-06 | Discharge: 2023-03-06 | Discharge status: Against Medical Advice | Payer: Medicaid Other | Attending: Emergency Medicine | Admitting: Emergency Medicine

## 2023-03-06 DIAGNOSIS — R110 Nausea: Secondary | ICD-10-CM | POA: Insufficient documentation

## 2023-03-06 DIAGNOSIS — R350 Frequency of micturition: Secondary | ICD-10-CM | POA: Insufficient documentation

## 2023-03-06 DIAGNOSIS — Z5321 Procedure and treatment not carried out due to patient leaving prior to being seen by health care provider: Secondary | ICD-10-CM | POA: Diagnosis not present

## 2023-03-06 DIAGNOSIS — R1030 Lower abdominal pain, unspecified: Secondary | ICD-10-CM | POA: Insufficient documentation

## 2023-03-06 LAB — URINALYSIS, ROUTINE W REFLEX MICROSCOPIC
Bilirubin Urine: NEGATIVE
Glucose, UA: NEGATIVE mg/dL
Hgb urine dipstick: NEGATIVE
Ketones, ur: NEGATIVE mg/dL
Leukocytes,Ua: NEGATIVE
Nitrite: NEGATIVE
Protein, ur: NEGATIVE mg/dL
Specific Gravity, Urine: <1.005 — ABNORMAL LOW (ref 1.005–1.030)
pH: 7 (ref 5.0–8.0)

## 2023-03-06 LAB — PREGNANCY, URINE: Preg Test, Ur: NEGATIVE

## 2023-03-06 MED ORDER — ONDANSETRON 4 MG PO TBDP
4.0000 mg | ORAL_TABLET | Freq: Once | ORAL | Status: AC | PRN
  Administered 2023-03-06: 4 mg via ORAL
  Filled 2023-03-06: qty 1

## 2023-03-06 NOTE — ED Triage Notes (Signed)
Pt states she was recently sick, states she's now having nausea again, no vomiting, no diarrhea, states she took a xanax and that has helped a bit with the nausea. Reports urinary frequency. Also endorses lower abdominal sharp pains.

## 2023-06-28 ENCOUNTER — Other Ambulatory Visit: Payer: Self-pay | Admitting: Nurse Practitioner

## 2023-06-28 DIAGNOSIS — Z1231 Encounter for screening mammogram for malignant neoplasm of breast: Secondary | ICD-10-CM

## 2023-07-05 NOTE — Progress Notes (Signed)
 [facility] CONSULT NOTE  Patient Care Team: Veverly Fells, NP as PCP - General (Nurse Practitioner) Jodelle Red, MD as PCP - Cardiology (Cardiology)  ASSESSMENT & PLAN:  Melanie Cooley is a 38 y.o.female with history of right T1a RCC s/p partial nephrectomy, benign adrenal adenoma, liver hemangioma being [facility] for history of RCC.  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 margins negative. No adverse feature or pathology.   She is now about more than 8 years since her definitive surgery.  She was not entirely sure if she had hereditary kidney cancer genetic testing.  She reports 5 other family members on her mother side with kidney cancers.  Given her age, will refer her to our Genetic counselor for evaluation, and testing. Assessment & Plan Malignant neoplasm of right kidney (HCC) US kidney ordered CBC, CMP, LDH Follow up with genetic counseling Follow-up in 1 year, earlier if needed  Orders Placed This Encounter  Procedures   US RENAL    Standing Status:   Future    Expected Date:   07/22/2023    Expiration Date:   07/07/2024    Reason for Exam (SYMPTOM  OR DIAGNOSIS REQUIRED):   history of early kidney cancer    Preferred imaging location?:   [facility]   CMP (Cancer Center only)    Standing Status:   Future    Expiration Date:   07/07/2024   CBC with Differential (Cancer Center Only)    Standing Status:   Future    Expiration Date:   07/07/2024   Lactate dehydrogenase (LDH)    Standing Status:   Future    Expiration Date:   07/07/2024   Ambulatory referral to Genetics    Referral Priority:   Routine    Referral Type:   Consultation    Referral Reason:   Specialty Services Required    Number of Visits Requested:   1   All questions were answered. The patient knows to call the clinic with any problems, questions or concerns. No barriers to learning was  detected.  Melven Sartorius, MD 3/24/202512:36 PM  CHIEF COMPLAINTS/PURPOSE OF CONSULTATION:  RCC  HISTORY OF PRESENTING ILLNESS:  Melanie Cooley 38 y.o. female is here because of history of Kidney cancer.  Report right RCC was found from [place] for pain due to having IUD. This was followed by MRI and biopsy.  Report maternal side with kidney cancer in 4 different individuals. Mother does not have kidney cancer. Mother's 2 aunts, her 2 uncles, great GF.   She cannot remember if her blood work showed anything.   No trouble urinating, bloody urine. No mass or lumps, decreased appetite.   I have reviewed her chart and materials related to her cancer extensively and collaborated history with the patient. Summary of oncologic history is as follows: Oncology History  Malignant tumor of kidney (HCC)  07/18/2014 Imaging   CT abd 2.2 x 2 x 2.2 cm enhancing, well-circumscribed mass in the lower pole of the right kidney. This is not consistent with an angiomyolipoma. Given areas of enhancement, neoplasm is a primary consideration.    08/13/2014 Pathology Results   Right kidney, core needle biopsy:   - suspicious for renal cell carcinoma, clear cell type    10/20/2014 Pathology Results   Kidney, right, robot assisted laparoscopic partial nephrectomy:  - tumor type: Clear cell renal cell carcinoma    -  site: Not specified    - size: 2.3 x 2.2 x 2.0 cm    - tumor focality: Single focus    - grade (Fuhrman Nuclear Grade): 2    - sarcomatoid features: none (0 %)    - tumor necrosis (any amount):  none    - macroscopic extension: Tumor limited to kidney    - microscopic extension: Tumor limited to kidney    - margins:      - margins negative for invasive carcinoma    - non-neoplastic kidney: Unremarkable    - lymph nodes: Not sampled    - TNM stage: pT1a    06/2015 Imaging   MRI ABD Status post right anterior partial nephrectomy. Some susceptibility  artifact in the surgical bed limits this exam.  Otherwise, no definite  evidence of residual or recurrent tumor.   Benign-appearing hemangioma in the right inferior liver.    06/2016 Imaging   MRI ABD Stable exam when compared to 07/04/2015.  No evidence of recurrent tumor in the right partial nephrectomy bed.   Nodular focus in the right suprarenal region is grossly stable when compared to the prior exams, and is favored to represent an adrenal adenoma.    06/2017 Imaging   MRI ABD 1.  No evidence of recurrence in the right partial nephrectomy site.  2.  Right adrenal nodularity, stable since 2017, likely an adenoma.    05/13/2018 Imaging   MRI ABD 1. Stable appearance of the right partial nephrectomy bed without evidence for local recurrence or metastatic disease in the abdomen. 2. Stable 17 mm right suprarenal nodule over the nearly 2 year interval since prior study. Coronal imaging suggests that this is adrenal and not exophytic renal in origin although there is some motion degradation given proximity to the hemidiaphragm. While overall imaging features are most suggestive of benign adrenal adenoma, CT abdomen (adrenal protocol) recommended to further evaluate given the better spatial resolution of CT imaging in order to confirm that this is not an exophytic renal lesion. 3. Stable 2.7 cm cavernous hemangioma in the inferior right liver.   09/04/2018 Imaging   MRI ABD Previous partial right nephrectomy. No evidence of recurrent or metastatic carcinoma within the abdomen.   Benign hepatic hemangioma.   12/12/2018 Imaging   MRI ABD The right hepatic lobe mass is consistent with a benign hemangioma.     04/15/2021 Initial Diagnosis   Malignant tumor of kidney (HCC)   07/08/2021 Imaging   MRI ABD 1. Similar postsurgical changes of partial right nephrectomy without evidence of local recurrence or metastatic disease. 2. Stable right adrenal nodule which is most consistent with a benign adrenal adenoma.   07/09/2022  Imaging   MRI ABD Partial right nephrectomy without evidence of recurrent or metastatic disease in the abdomen.     MEDICAL HISTORY:  Past Medical History:  Diagnosis Date   Anxiety    Depression    Preeclampsia 2023   Renal cell carcinoma (HCC) 2017    SURGICAL HISTORY: Past Surgical History:  Procedure Laterality Date   CESAREAN SECTION N/A 05/09/2021   Procedure: Primary CESAREAN SECTION;  Surgeon: Shea Evans, MD;  Location: [facility];  Service: Obstetrics;  Laterality: N/A;  EDD: 06/09/21   EXCISION OF SKIN TAG N/A 01/30/2022   Procedure: EXCISION OF ANAL SKIN TAG;  Surgeon: Violeta Gelinas, MD;  Location: [facility];  Service: General;  Laterality: N/A;   INDUCED ABORTION     NEPHRECTOMY Right 2017  Partial due to cancer    SOCIAL HISTORY: Social History   Socioeconomic History   Marital status: Divorced    Spouse name: Not on file   Number of children: Not on file   Years of education: Not on file   Highest education level: Not on file  Occupational History   Not on file  Tobacco Use   Smoking status: Never   Smokeless tobacco: Never  Vaping Use   Vaping status: Never Used  Substance and Sexual Activity   Alcohol use: Never   Drug use: Never   Sexual activity: Not on file  Other Topics Concern   Not on file  Social History Narrative   Not on file   Social Drivers of Health   Financial Resource Strain: Not on File (12/09/2017)   Received from [employer], [employer]    Financial Resource Strain: 0  Food Insecurity: Not on File (12/09/2017)   Received from [place], [place]   Food Insecurity    Food: 0  Transportation Needs: Not on File (12/09/2017)   Received from [employer], [employer] Needs    Transportation: 0  Physical Activity: Not on File (12/09/2017)   Received from [place], [place]   Physical Activity    Physical Activity: 0  Stress: Not on File (12/09/2017)   Received from [place], [place]   Stress     Stress: 0  Social Connections: Unknown (08/15/2021)   Received from [facility], [facility]   Social Network    Social Network: Not on file  Intimate Partner Violence: Unknown (07/20/2021)   Received from [facility], [facility]   HITS    Physically Hurt: Not on file    Insult or Talk Down To: Not on file    Threaten Physical Harm: Not on file    Scream or Curse: Not on file    FAMILY HISTORY: Family History  Problem Relation Age of Onset   Hyperlipidemia Father    Cancer Maternal Grandmother    Cancer Maternal Grandfather    Mesothelioma Paternal Grandmother     ALLERGIES:  is allergic to compazine [prochlorperazine], tape, and betadine [povidone-iodine].  MEDICATIONS:  Current Outpatient Medications  Medication Sig Dispense Refill   albuterol (VENTOLIN HFA) 108 (90 Base) MCG/ACT inhaler Inhale into the lungs every 6 (six) hours as needed for wheezing or shortness of breath.     magnesium gluconate (MAGONATE) 500 MG tablet Take 500 mg by mouth daily.     Multiple Minerals (CHELATED MULTIPLE MINERAL PO) Take by mouth.     Multiple Vitamins-Minerals (ANTIOXIDANT PO) Take by mouth.     No current facility-administered medications for this visit.    REVIEW OF SYSTEMS:   All relevant systems were reviewed with the patient and are negative.  PHYSICAL EXAMINATION: ECOG PERFORMANCE STATUS: 0 - Asymptomatic  Vitals:   07/08/23 1148  BP: (!) 141/82  Pulse: 71  Resp: 15  Temp: (!) 97.3 F (36.3 C)  SpO2: 100%   Filed Weights   07/08/23 1148  Weight: 148 lb 12.8 oz (67.5 kg)    GENERAL: alert, no distress and comfortable SKIN: skin color is normal, no jaundice EYES: sclera clear OROPHARYNX: no exudate, no erythema NECK: supple LYMPH:  no palpable lymphadenopathy in the cervical regions LUNGS: Effort normal, no respiratory distress.  Clear to auscultation bilaterally HEART: regular rate & rhythm and no lower extremity edema ABDOMEN: soft, non-tender and  nondistended Musculoskeletal: no point tenderness NEURO: no focal motor/sensory  deficits  LABORATORY DATA:  I have reviewed the data as listed Lab Results  Component Value Date   WBC 5.5 06/17/2021   HGB 11.8 (L) 06/17/2021   HCT 36.1 06/17/2021   MCV 88.7 06/17/2021   PLT 377 06/17/2021   No results for input(s): "NA", "K", "CL", "CO2", "GLUCOSE", "BUN", "CREATININE", "CALCIUM", "GFRNONAA", "GFRAA", "PROT", "ALBUMIN", "AST", "ALT", "ALKPHOS", "BILITOT", "BILIDIR", "IBILI" in the last 8760 hours.  RADIOGRAPHIC STUDIES: I have personally reviewed the radiological images as listed and agreed with the findings in the report. No results found.

## 2023-07-08 ENCOUNTER — Inpatient Hospital Stay

## 2023-07-08 VITALS — BP 141/82 | HR 71 | Temp 97.3°F | Resp 15 | Ht 66.0 in | Wt 148.8 lb

## 2023-07-08 DIAGNOSIS — Z85528 Personal history of other malignant neoplasm of kidney: Secondary | ICD-10-CM | POA: Diagnosis present

## 2023-07-08 DIAGNOSIS — C641 Malignant neoplasm of right kidney, except renal pelvis: Secondary | ICD-10-CM

## 2023-07-08 DIAGNOSIS — Z905 Acquired absence of kidney: Secondary | ICD-10-CM | POA: Insufficient documentation

## 2023-07-08 DIAGNOSIS — Z8051 Family history of malignant neoplasm of kidney: Secondary | ICD-10-CM | POA: Insufficient documentation

## 2023-07-08 LAB — LACTATE DEHYDROGENASE: LDH: 127 U/L (ref 98–192)

## 2023-07-08 LAB — CBC WITH DIFFERENTIAL (CANCER CENTER ONLY)
Abs Immature Granulocytes: 0.01 10*3/uL (ref 0.00–0.07)
Basophils Absolute: 0 10*3/uL (ref 0.0–0.1)
Basophils Relative: 0 %
Eosinophils Absolute: 0.1 10*3/uL (ref 0.0–0.5)
Eosinophils Relative: 1 %
HCT: 38.5 % (ref 36.0–46.0)
Hemoglobin: 12.7 g/dL (ref 12.0–15.0)
Immature Granulocytes: 0 %
Lymphocytes Relative: 19 %
Lymphs Abs: 1.7 10*3/uL (ref 0.7–4.0)
MCH: 29.4 pg (ref 26.0–34.0)
MCHC: 33 g/dL (ref 30.0–36.0)
MCV: 89.1 fL (ref 80.0–100.0)
Monocytes Absolute: 0.8 10*3/uL (ref 0.1–1.0)
Monocytes Relative: 9 %
Neutro Abs: 6.3 10*3/uL (ref 1.7–7.7)
Neutrophils Relative %: 71 %
Platelet Count: 300 10*3/uL (ref 150–400)
RBC: 4.32 MIL/uL (ref 3.87–5.11)
RDW: 12.4 % (ref 11.5–15.5)
WBC Count: 8.8 10*3/uL (ref 4.0–10.5)
nRBC: 0 % (ref 0.0–0.2)

## 2023-07-08 LAB — CMP (CANCER CENTER ONLY)
ALT: 14 U/L (ref 0–44)
AST: 22 U/L (ref 15–41)
Albumin: 4.9 g/dL (ref 3.5–5.0)
Alkaline Phosphatase: 46 U/L (ref 38–126)
Anion gap: 7 (ref 5–15)
BUN: 15 mg/dL (ref 6–20)
CO2: 28 mmol/L (ref 22–32)
Calcium: 10.1 mg/dL (ref 8.9–10.3)
Chloride: 101 mmol/L (ref 98–111)
Creatinine: 0.71 mg/dL (ref 0.44–1.00)
GFR, Estimated: >60 mL/min (ref >60)
Glucose, Bld: 98 mg/dL (ref 70–99)
Potassium: 3.9 mmol/L (ref 3.5–5.1)
Sodium: 136 mmol/L (ref 135–145)
Total Bilirubin: 0.4 mg/dL (ref 0.0–1.2)
Total Protein: 8.2 g/dL — ABNORMAL HIGH (ref 6.5–8.1)

## 2023-07-08 NOTE — Assessment & Plan Note (Signed)
 US kidney ordered CBC, CMP, LDH Follow up with genetic counseling Follow-up in 1 year, earlier if needed

## 2023-07-19 ENCOUNTER — Telehealth: Payer: Self-pay | Admitting: Genetic Counselor

## 2023-07-19 ENCOUNTER — Ambulatory Visit
Admission: RE | Admit: 2023-07-19 | Discharge: 2023-07-19 | Disposition: A | Discharge status: Home / Self Care | Source: Ambulatory Visit | Attending: Nurse Practitioner | Admitting: Nurse Practitioner

## 2023-07-19 DIAGNOSIS — Z1231 Encounter for screening mammogram for malignant neoplasm of breast: Secondary | ICD-10-CM

## 2023-07-19 NOTE — Telephone Encounter (Signed)
 Spoke with pt about scheduled appt time and date.

## 2023-07-22 ENCOUNTER — Ambulatory Visit (HOSPITAL_COMMUNITY)
Admission: RE | Admit: 2023-07-22 | Discharge: 2023-07-22 | Disposition: A | Discharge status: Home / Self Care | Source: Ambulatory Visit

## 2023-07-22 DIAGNOSIS — C641 Malignant neoplasm of right kidney, except renal pelvis: Secondary | ICD-10-CM | POA: Insufficient documentation

## 2023-07-25 ENCOUNTER — Other Ambulatory Visit: Payer: Self-pay | Admitting: Nurse Practitioner

## 2023-07-25 DIAGNOSIS — R928 Other abnormal and inconclusive findings on diagnostic imaging of breast: Secondary | ICD-10-CM

## 2023-07-29 ENCOUNTER — Telehealth: Payer: Self-pay | Admitting: *Deleted

## 2023-07-29 NOTE — Telephone Encounter (Signed)
 Notified of message below

## 2023-07-29 NOTE — Telephone Encounter (Signed)
-----   Message from Melanie Cooley sent at 07/29/2023  8:57 AM EDT ----- Dorathy Gals please let her know good news. No signs of new kidney mass. Labs were fine without concerning findings. Thanks.   Follow-up in 1 year, earlier if needed

## 2023-08-02 ENCOUNTER — Ambulatory Visit
Admission: RE | Admit: 2023-08-02 | Discharge: 2023-08-02 | Disposition: A | Discharge status: Home / Self Care | Source: Ambulatory Visit | Attending: Nurse Practitioner | Admitting: Nurse Practitioner

## 2023-08-02 DIAGNOSIS — R928 Other abnormal and inconclusive findings on diagnostic imaging of breast: Secondary | ICD-10-CM

## 2023-08-09 ENCOUNTER — Other Ambulatory Visit

## 2023-08-09 ENCOUNTER — Encounter

## 2023-09-05 ENCOUNTER — Telehealth: Payer: Self-pay | Admitting: Genetic Counselor

## 2023-09-11 ENCOUNTER — Inpatient Hospital Stay: Admitting: Genetic Counselor

## 2023-09-11 ENCOUNTER — Inpatient Hospital Stay

## 2023-11-15 ENCOUNTER — Encounter: Payer: Self-pay | Admitting: Genetic Counselor

## 2023-11-20 ENCOUNTER — Inpatient Hospital Stay

## 2023-11-20 ENCOUNTER — Encounter: Payer: Self-pay | Admitting: Genetic Counselor

## 2023-11-20 ENCOUNTER — Other Ambulatory Visit: Payer: Self-pay | Admitting: Genetic Counselor

## 2023-11-20 ENCOUNTER — Inpatient Hospital Stay: Attending: Genetic Counselor | Admitting: Genetic Counselor

## 2023-11-20 DIAGNOSIS — C641 Malignant neoplasm of right kidney, except renal pelvis: Secondary | ICD-10-CM | POA: Insufficient documentation

## 2023-11-20 DIAGNOSIS — Z1379 Encounter for other screening for genetic and chromosomal anomalies: Secondary | ICD-10-CM

## 2023-11-20 DIAGNOSIS — Z803 Family history of malignant neoplasm of breast: Secondary | ICD-10-CM | POA: Diagnosis not present

## 2023-11-20 DIAGNOSIS — Z8052 Family history of malignant neoplasm of bladder: Secondary | ICD-10-CM | POA: Insufficient documentation

## 2023-11-20 DIAGNOSIS — Z809 Family history of malignant neoplasm, unspecified: Secondary | ICD-10-CM | POA: Insufficient documentation

## 2023-11-20 DIAGNOSIS — C649 Malignant neoplasm of unspecified kidney, except renal pelvis: Secondary | ICD-10-CM

## 2023-11-20 DIAGNOSIS — Z800 Family history of malignant neoplasm of digestive organs: Secondary | ICD-10-CM | POA: Diagnosis not present

## 2023-11-20 DIAGNOSIS — Z8051 Family history of malignant neoplasm of kidney: Secondary | ICD-10-CM

## 2023-11-20 LAB — GENETIC SCREENING ORDER

## 2023-11-20 NOTE — Progress Notes (Signed)
 REFERRING PROVIDER: Tina Hudson, MD  PRIMARY PROVIDER:  Emilio Domino, NP  PRIMARY REASON FOR VISIT:  Encounter Diagnoses  Name Primary?   Malignant neoplasm of kidney, unspecified laterality (HCC) Yes   Family history of kidney cancer    Family history of breast cancer    Family history of stomach cancer    HISTORY OF PRESENT ILLNESS:   Melanie Cooley, a 38 y.o. female, was seen for a [facility] cancer genetics consultation at the request of Dr. Tina due to a personal and family history of kidney cancer.  Melanie Cooley presents to clinic today to discuss the possibility of a hereditary predisposition to cancer, to discuss genetic testing, and to further clarify her future cancer risks, as well as potential cancer risks for family members.   Melanie Cooley was diagnosed with kidney cancer in 2016 around age 36.   CANCER HISTORY:  Oncology History  Malignant tumor of kidney (HCC)  07/18/2014 Imaging   CT abd 2.2 x 2 x 2.2 cm enhancing, well-circumscribed mass in the lower pole of the right kidney. This is not consistent with an angiomyolipoma. Given areas of enhancement, neoplasm is a primary consideration.    08/13/2014 Pathology Results   Right kidney, core needle biopsy:   - suspicious for renal cell carcinoma, clear cell type    10/20/2014 Pathology Results   Kidney, right, robot assisted laparoscopic partial nephrectomy:  - tumor type: Clear cell renal cell carcinoma    - site: Not specified    - size: 2.3 x 2.2 x 2.0 cm    - tumor focality: Single focus    - grade (Fuhrman Nuclear Grade): 2    - sarcomatoid features: none (0 %)    - tumor necrosis (any amount):  none    - macroscopic extension: Tumor limited to kidney    - microscopic extension: Tumor limited to kidney    - margins:      - margins negative for invasive carcinoma    - non-neoplastic kidney: Unremarkable    - lymph nodes: Not sampled    - TNM stage: pT1a    06/2015 Imaging   MRI ABD Status post right  anterior partial nephrectomy. Some susceptibility  artifact in the surgical bed limits this exam. Otherwise, no definite  evidence of residual or recurrent tumor.   Benign-appearing hemangioma in the right inferior liver.    06/2016 Imaging   MRI ABD Stable exam when compared to 07/04/2015.  No evidence of recurrent tumor in the right partial nephrectomy bed.   Nodular focus in the right suprarenal region is grossly stable when compared to the prior exams, and is favored to represent an adrenal adenoma.    06/2017 Imaging   MRI ABD 1.  No evidence of recurrence in the right partial nephrectomy site.  2.  Right adrenal nodularity, stable since 2017, likely an adenoma.    05/13/2018 Imaging   MRI ABD 1. Stable appearance of the right partial nephrectomy bed without evidence for local recurrence or metastatic disease in the abdomen. 2. Stable 17 mm right suprarenal nodule over the nearly 2 year interval since prior study. Coronal imaging suggests that this is adrenal and not exophytic renal in origin although there is some motion degradation given proximity to the hemidiaphragm. While overall imaging features are most suggestive of benign adrenal adenoma, CT abdomen (adrenal protocol) recommended to further evaluate given the better spatial resolution of CT imaging in order to confirm that this is not an exophytic renal  lesion. 3. Stable 2.7 cm cavernous hemangioma in the inferior right liver.   09/04/2018 Imaging   MRI ABD Previous partial right nephrectomy. No evidence of recurrent or metastatic carcinoma within the abdomen.   Benign hepatic hemangioma.   12/12/2018 Imaging   MRI ABD The right hepatic lobe mass is consistent with a benign hemangioma.     04/15/2021 Initial Diagnosis   Malignant tumor of kidney (HCC)   07/08/2021 Imaging   MRI ABD 1. Similar postsurgical changes of partial right nephrectomy without evidence of local recurrence or metastatic disease. 2. Stable  right adrenal nodule which is most consistent with a benign adrenal adenoma.   07/09/2022 Imaging   MRI ABD Partial right nephrectomy without evidence of recurrent or metastatic disease in the abdomen.      RISK FACTORS:  Menarche was at age 30.  First live birth at age 53.  Ovaries intact: yes.  Uterus intact: yes.  Menopausal status: premenopausal.  HRT use: 0 years. Colonoscopy: no Mammogram within the last year: yes. Number of breast biopsies: 0. Any excessive radiation exposure in the past: no  Past Medical History:  Diagnosis Date   Anxiety    Depression    Preeclampsia 2023   Renal cell carcinoma (HCC) 2017    Past Surgical History:  Procedure Laterality Date   CESAREAN SECTION N/A 05/09/2021   Procedure: Primary CESAREAN SECTION;  Surgeon: Barbette Knock, MD;  Location: [facility];  Service: Obstetrics;  Laterality: N/A;  EDD: 06/09/21   EXCISION OF SKIN TAG N/A 01/30/2022   Procedure: EXCISION OF ANAL SKIN TAG;  Surgeon: Sebastian Moles, MD;  Location: [facility];  Service: General;  Laterality: N/A;   INDUCED ABORTION     NEPHRECTOMY Right 2017   Partial due to cancer    Social History   Socioeconomic History   Marital status: Divorced    Spouse name: Not on file   Number of children: Not on file   Years of education: Not on file   Highest education level: Not on file  Occupational History   Not on file  Tobacco Use   Smoking status: Never   Smokeless tobacco: Never  Vaping Use   Vaping status: Never Used  Substance and Sexual Activity   Alcohol use: Never   Drug use: Never   Sexual activity: Not on file  Other Topics Concern   Not on file  Social History Narrative   Not on file   Social Drivers of Health   Financial Resource Strain: Not on File (12/09/2017)   Received from [employer]    Financial Resource Strain: 0  Food Insecurity: Not on File (12/09/2017)   Received from [facility] Insecurity     Food: 0  Transportation Needs: Not on File (12/09/2017)   Received from [employer] Needs    Transportation: 0  Physical Activity: Not on File (12/09/2017)   Received from [facility]   Physical Activity    Physical Activity: 0  Stress: Not on File (12/09/2017)   Received from [facility]   Stress    Stress: 0  Social Connections: Unknown (08/15/2021)   Received from [employer]   Social Network    Social Network: Not on file     FAMILY HISTORY:  We obtained a detailed, 4-generation family history.  Significant diagnoses are listed below: Family History  Problem Relation Age of Onset   Hyperlipidemia Father    Brain cancer Maternal  Aunt 20 - 29       glioblastoma   Breast cancer Maternal Grandmother 10       dx. second primary metstatic breast cancer in her 64s   Skin cancer Maternal Grandmother        non-melanoma   Stomach cancer Maternal Grandfather 71 - 49   Mesothelioma Paternal Grandmother        dx. w/ mesothelioma after breast cancer   Breast cancer Paternal Grandmother 70   Kidney cancer Other        2 maternal great uncles (grandfather's brothers), dx. >50   Kidney cancer Other        maternal great aunt (grandfather's sister), dx. >50      Ms. Conradt's mother reportedly had negative genetic testing. There is no reported Ashkenazi Jewish ancestry.   GENETIC COUNSELING ASSESSMENT: Melanie Cooley is a 38 y.o. female with a personal and family history of kidney cancer which is somewhat suggestive of a hereditary predisposition to cancer. We, therefore, discussed and recommended the following at today's visit.   DISCUSSION: We discussed that 5 - 10% of cancer is hereditary and there are many genes associated with kidney cancer syndromes as well as other cancer syndromes. We discussed that testing is beneficial for several reasons including knowing how to follow individuals and understanding if other family members could be at risk for cancer and allowing them to undergo  genetic testing.   We reviewed the characteristics, features and inheritance patterns of hereditary cancer syndromes. We also discussed genetic testing, including the appropriate family members to test, the process of testing, insurance coverage and turn-around-time for results. We discussed the implications of a negative, positive, carrier and/or variant of uncertain significant result. We recommended Ms. Belles pursue genetic testing for a panel that includes genes associated with kidney, breast, and stomach cancer.   Ms. Alia elected to have [facility] CancerNext-Expanded Panel. The CancerNext-Expanded gene panel offered by [facility] and includes sequencing, rearrangement, and RNA analysis for the following 77 genes: AIP, ALK, APC, ATM, AXIN2, BAP1, BARD1, BMPR1A, BRCA1, BRCA2, BRIP1, CDC73, CDH1, CDK4, CDKN1B, CDKN2A, CEBPA, CHEK2, CTNNA1, DDX41, DICER1, ETV6, FH, FLCN, GATA2, LZTR1, MAX, MBD4, MEN1, MET, MLH1, MSH2, MSH3, MSH6, MUTYH, NF1, NF2, NTHL1, PALB2, PHOX2B, PMS2, POT1, PRKAR1A, PTCH1, PTEN, RAD51C, RAD51D, RB1, RET, RPS20, RUNX1, SDHA, SDHAF2, SDHB, SDHC, SDHD, SMAD4, SMARCA4, SMARCB1, SMARCE1, STK11, SUFU, TMEM127, TP53, TSC1, TSC2, VHL, and WT1 (sequencing and deletion/duplication); EGFR, HOXB13, KIT, MITF, PDGFRA, POLD1, and POLE (sequencing only); EPCAM and GREM1 (deletion/duplication only).   Based on Ms. Augenstein's personal and family history of cancer, she meets medical criteria for genetic testing. Her current insurance is Medicaid. Therefore, the testing should be of no charge.  PLAN: After considering the risks, benefits, and limitations, Ms. Wissner provided informed consent to pursue genetic testing and the blood sample was sent to [facility] for analysis of the CancerNext-Expanded Panel. Results should be available within approximately 2-3 weeks' time, at which point they will be disclosed by telephone to Ms. Basham, as will any additional recommendations warranted by  these results. Ms. Mcewen will receive a summary of her genetic counseling visit and a copy of her results once available. This information will also be available in Epic.   Ms. Christy's questions were answered to her satisfaction today. Our contact information was provided should additional questions or concerns arise. Thank you for the referral and allowing us  to share in the care of your patient.   Aaban Griep, MS, LCGC  Genetic Counselor [email or web address] (P) [phone]  40 minutes were spent on the date of the encounter in service to the patient including preparation, face-to-face consultation, documentation and care coordination. The patient was seen alone.  Drs. Gudena and/or Lanny were available to discuss this case as needed.   _______________________________________________________________________ For Office Staff:  Number of people involved in session: 1 Was an Intern/ student involved with case: no

## 2023-12-03 ENCOUNTER — Telehealth: Payer: Self-pay | Admitting: Genetic Counselor

## 2023-12-03 ENCOUNTER — Encounter: Payer: Self-pay | Admitting: Genetic Counselor

## 2023-12-03 ENCOUNTER — Ambulatory Visit: Payer: Self-pay | Admitting: Genetic Counselor

## 2023-12-03 DIAGNOSIS — Z1379 Encounter for other screening for genetic and chromosomal anomalies: Secondary | ICD-10-CM | POA: Insufficient documentation

## 2023-12-03 NOTE — Progress Notes (Unsigned)
 HPI:   Melanie Cooley was previously seen in the [facility] due to a personal and family history of cancer and concerns regarding a hereditary predisposition to cancer. Please refer to our prior cancer genetics clinic note for more information regarding our discussion, assessment and recommendations, at the time. Melanie Cooley's recent genetic test results were disclosed to her, as were recommendations warranted by these results. These results and recommendations are discussed in more detail below.  CANCER HISTORY:  Oncology History  Malignant tumor of kidney (HCC)  07/18/2014 Imaging   CT abd 2.2 x 2 x 2.2 cm enhancing, well-circumscribed mass in the lower pole of the right kidney. This is not consistent with an angiomyolipoma. Given areas of enhancement, neoplasm is a primary consideration.    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 Some susceptibility  artifact in the surgical bed limits this exam. Otherwise, no definite  evidence of residual or recurrent tumor.   Benign-appearing hemangioma in the right inferior liver.    06/2016 Imaging   MRI ABD Stable exam when compared to 07/04/2015.  No evidence of  recurrent tumor in the right partial nephrectomy bed.   Nodular focus in the right suprarenal region is grossly stable when compared to the prior exams, and is favored to represent an adrenal adenoma.    06/2017 Imaging   MRI ABD 1.  No evidence of recurrence in the right partial nephrectomy site.  2.  Right adrenal nodularity, stable since 2017, likely an adenoma.    05/13/2018 Imaging   MRI ABD 1. Stable appearance of the right partial nephrectomy bed without evidence for local recurrence or metastatic disease in the abdomen. 2. Stable 17 mm right suprarenal nodule over the nearly 2 year interval since prior study. Coronal imaging suggests that this is adrenal and not exophytic renal in origin although there is some motion degradation given proximity to the hemidiaphragm. While overall imaging features are most suggestive of benign adrenal adenoma, CT abdomen (adrenal protocol) recommended to further evaluate given the better spatial resolution of CT imaging in order to confirm that this is not an exophytic renal lesion. 3. Stable 2.7 cm cavernous hemangioma in the inferior right liver.   09/04/2018 Imaging   MRI ABD Previous partial right nephrectomy. No evidence of recurrent or metastatic carcinoma within the abdomen.   Benign hepatic hemangioma.   12/12/2018 Imaging   MRI ABD The right hepatic lobe mass is consistent with a benign hemangioma.     04/15/2021 Initial Diagnosis   Malignant tumor  of kidney (HCC)   07/08/2021 Imaging   MRI ABD 1. Similar postsurgical changes of partial right nephrectomy without evidence of local recurrence or metastatic disease. 2. Stable right adrenal nodule which is most consistent with a benign adrenal adenoma.   07/09/2022 Imaging   MRI ABD Partial right nephrectomy without evidence of recurrent or metastatic disease in the abdomen.     FAMILY HISTORY:  We obtained a detailed, 4-generation family history.  Significant diagnoses are  listed below:      Family History  Problem Relation Age of Onset   Hyperlipidemia Father     Brain cancer Maternal Aunt 20 - 29        glioblastoma   Breast cancer Maternal Grandmother 41        dx. second primary metstatic breast cancer in her 53s   Skin cancer Maternal Grandmother          non-melanoma   Stomach cancer Maternal Grandfather 85 - 71   Mesothelioma Paternal Grandmother          dx. w/ mesothelioma after breast cancer   Breast cancer Paternal Grandmother 54   Kidney cancer Other          2 maternal great uncles (grandfather's brothers), dx. >50   Kidney cancer Other          maternal great aunt (grandfather's sister), dx. >50             Melanie Cooley's mother reportedly had negative genetic testing. There is no reported Ashkenazi Jewish ancestry.   GENETIC TEST RESULTS:  The [facility] CancerNext-Expanded Panel found no pathogenic mutations.   The CancerNext-Expanded gene panel offered by [facility] and includes sequencing, rearrangement, and RNA analysis for the following 77 genes: AIP, ALK, APC, ATM, AXIN2, BAP1, BARD1, BMPR1A, BRCA1, BRCA2, BRIP1, CDC73, CDH1, CDK4, CDKN1B, CDKN2A, CEBPA, CHEK2, CTNNA1, DDX41, DICER1, ETV6, FH, FLCN, GATA2, LZTR1, MAX, MBD4, MEN1, MET, MLH1, MSH2, MSH3, MSH6, MUTYH, NF1, NF2, NTHL1, PALB2, PHOX2B, PMS2, POT1, PRKAR1A, PTCH1, PTEN, RAD51C, RAD51D, RB1, RET, RPS20, RUNX1, SDHA, SDHAF2, SDHB, SDHC, SDHD, SMAD4, SMARCA4, SMARCB1, SMARCE1, STK11, SUFU, TMEM127, TP53, TSC1, TSC2, VHL, and WT1 (sequencing and deletion/duplication); EGFR, HOXB13, KIT, MITF, PDGFRA, POLD1, and POLE (sequencing only); EPCAM and GREM1 (deletion/duplication only).   The test report has been scanned into EPIC and is located under the Molecular Pathology section of the Results Review tab.  A portion of the result report is included below for reference. Genetic testing reported out on 11/30/2023.      Even though a pathogenic variant was not identified, possible  explanations for the cancer in the family may include: There may be no hereditary risk for cancer in the family. The cancers in Melanie Cooley and/or her family may be due to other genetic or environmental factors. There may be a gene mutation in one of these genes that current testing methods cannot detect, but that chance is small. There could be another gene that has not yet been discovered, or that we have not yet tested, that is responsible for the cancer diagnoses in the family.  It is also possible there is a hereditary cause for the cancer in the family that Melanie Cooley did not inherit.  Therefore, it is important to remain in touch with cancer genetics in the future so that we can continue to offer Melanie Cooley the most up to date genetic testing.   ADDITIONAL GENETIC TESTING:  We discussed with Melanie Cooley that her genetic testing was fairly  extensive.  If there are genes identified to increase cancer risk that can be analyzed in the future, we would be happy to discuss and coordinate this testing at that time.    CANCER SCREENING RECOMMENDATIONS:  Melanie Cooley's test result is considered negative (normal).  This means that we have not identified a hereditary cause for her personal and family history of cancer at this time.   An individual's cancer risk and medical management are not determined by genetic test results alone. Overall cancer risk assessment incorporates additional factors, including personal medical history, family history, and any available genetic information that may result in a personalized plan for cancer prevention and surveillance. Therefore, it is recommended she continue to follow the cancer management and screening guidelines provided by her healthcare providers.  Based on the reported personal and family history, specific cancer screenings for Melanie Cooley and her family include:  Breast Cancer Screening:  The Tyrer-Cuzick model is one of multiple prediction  models developed to estimate an individual's lifetime risk of developing breast cancer. The Tyrer-Cuzick model is endorsed by the [employer] (NCCN). This model includes many risk factors such as family history, endogenous estrogen exposure, and benign breast disease. The calculation is highly-dependent on the accuracy of clinical data provided by the patient and can change over time. The Tyrer-Cuzick model may be repeated to reflect new information in her personal or family history in the future.   Melanie Cooley's Tyrer-Cuzick risk score is 22.0%.  For women with a greater than 20% lifetime risk of breast cancer, the NCCN recommends the following:    1.   Clinical encounter every 6-12 months to begin when identified as being at increased risk, but not before age 8    2.   Annual mammograms, tomosynthesis is recommended starting 10 years earlier than the youngest breast cancer diagnosis in the family or at age 33 (whichever comes first), but not before age 100     7.   Annual breast MRI starting 10 years earlier than the youngest breast cancer diagnosis in the family or at age 17 (whichever comes first), but not before age 80  We, therefore, discussed that it is reasonable for Melanie Cooley to be followed by a high-risk breast cancer clinic; in addition to a yearly mammogram and physical exam by a healthcare provider. She requested a referral to [facility] high risk clinic.      RECOMMENDATIONS FOR FAMILY MEMBERS:   Since she did not inherit a mutation in a cancer predisposition gene included on this panel, her son could not have inherited a mutation from her in one of these genes.  FOLLOW-UP:  Cancer genetics is a rapidly advancing field and it is possible that new genetic tests will be appropriate for her and/or her family members in the future. We encouraged her to remain in contact with cancer genetics on an annual basis so we can update her personal and family  histories and let her know of advances in cancer genetics that may benefit this family.   Our contact number was provided. Melanie Cooley's questions were answered to her satisfaction, and she knows she is welcome to call us  at anytime with additional questions or concerns.   Janeshia Ciliberto, MS, [facility] Genetic Counselor [email or web address] (P) [phone]

## 2023-12-03 NOTE — Telephone Encounter (Signed)
 I attempted to contact Ms. Kautzman to discuss her genetic testing results (77 genes). I left a voicemail requesting she call me back at [phone].  Ezma Rehm, MS, [facility] Genetic Counselor [email or web address] (P) [phone]

## 2023-12-04 ENCOUNTER — Telehealth: Payer: Self-pay | Admitting: Genetic Counselor

## 2023-12-04 NOTE — Telephone Encounter (Addendum)
 I was contacted by Melanie Cooley to discuss her genetic testing results. No pathogenic variants were identified in the 77 genes analyzed. Discussed that we do not know why she has kidney cancer or why there is cancer in the family. It could be due to a different gene that we are not testing, or maybe our current technology may not be able to pick something up.  It will be important for her to keep in contact with genetics to keep up with whether additional testing may be needed.Detailed clinic note to follow.   The test report will be scanned into EPIC and will be located under the Molecular Pathology section of the Results Review tab.  A portion of the result report is included below for reference.

## 2023-12-05 ENCOUNTER — Encounter: Payer: Self-pay | Admitting: Genetic Counselor

## 2023-12-05 DIAGNOSIS — Z9189 Other specified personal risk factors, not elsewhere classified: Secondary | ICD-10-CM | POA: Insufficient documentation

## 2023-12-31 ENCOUNTER — Inpatient Hospital Stay: Attending: Hematology and Oncology | Admitting: Hematology and Oncology

## 2023-12-31 ENCOUNTER — Inpatient Hospital Stay

## 2024-02-18 ENCOUNTER — Telehealth: Payer: Self-pay

## 2024-02-18 ENCOUNTER — Inpatient Hospital Stay: Attending: Hematology and Oncology | Admitting: Hematology and Oncology

## 2024-02-18 ENCOUNTER — Inpatient Hospital Stay

## 2024-02-18 NOTE — Telephone Encounter (Signed)
 Called to see if patient was going to make her appointment today.. went straight to voicemail left message asking her to call back and reschedule if she needed to.

## 2024-07-07 ENCOUNTER — Other Ambulatory Visit

## 2024-07-07 ENCOUNTER — Ambulatory Visit
